# Patient Record
Sex: Male | Born: 1945 | Race: White | Hispanic: No | Marital: Married | Smoking: Former smoker
Health system: Southern US, Community
[De-identification: ages and names within clinical notes are randomized; demographics above are authoritative.]

## PROBLEM LIST (undated history)

## (undated) DIAGNOSIS — R42 Dizziness and giddiness: Secondary | ICD-10-CM

## (undated) HISTORY — DX: Dizziness and giddiness: R42

## (undated) HISTORY — PX: FOOT SURGERY: SHX648

## (undated) HISTORY — PX: ROTATOR CUFF REPAIR: SHX139

## (undated) HISTORY — PX: ANTERIOR CRUCIATE LIGAMENT REPAIR: SHX115

---

## 2007-11-30 ENCOUNTER — Emergency Department (HOSPITAL_COMMUNITY): Admission: EM | Admit: 2007-11-30 | Discharge: 2007-11-30 | Payer: Self-pay | Admitting: Emergency Medicine

## 2007-12-06 ENCOUNTER — Emergency Department (HOSPITAL_COMMUNITY): Admission: EM | Admit: 2007-12-06 | Discharge: 2007-12-06 | Payer: Self-pay | Admitting: Emergency Medicine

## 2007-12-08 ENCOUNTER — Ambulatory Visit: Payer: Self-pay | Admitting: Internal Medicine

## 2007-12-17 ENCOUNTER — Ambulatory Visit: Payer: Self-pay | Admitting: Internal Medicine

## 2007-12-17 ENCOUNTER — Ambulatory Visit: Payer: Self-pay

## 2007-12-17 ENCOUNTER — Inpatient Hospital Stay (HOSPITAL_COMMUNITY): Admission: EM | Admit: 2007-12-17 | Discharge: 2007-12-18 | Payer: Self-pay | Admitting: Emergency Medicine

## 2007-12-30 ENCOUNTER — Ambulatory Visit: Payer: Self-pay | Admitting: Internal Medicine

## 2008-01-26 ENCOUNTER — Ambulatory Visit: Payer: Self-pay | Admitting: Internal Medicine

## 2010-11-13 NOTE — H&P (Signed)
Brent Larsen, Brent Larsen                ACCOUNT NO.:  [id number]   MEDICAL RECORD NO.:  [id number]          PATIENT TYPE:  INP   LOCATION:  2005                         FACILITY:  [facility]   PHYSICIAN:  Everardo Beals. Juanda Chance, MD, FACCDATE OF BIRTH:  Aug 31, 1945   DATE OF ADMISSION:  12/17/2007  DATE OF DISCHARGE:                              HISTORY & PHYSICAL   PRIMARY CARDIOLOGIST:  Bevelyn Buckles. Bensimhon, MD   PRIMARY CARE [place] Olkowski:  Dr. Carola Frost   PATIENT PROFILE:  A 65 year old married Caucasian male without prior  history of coronary artery disease who presents following a stress test  during which he had nonsustained ventricular tachycardia which was  asymptomatic.   PROBLEMS:  1. Nonsustained ventricular tachycardia (asymptomatic).  2. History of weakness spells with bilateral arm discomfort.      a.     December 17, 2007, Myoview exercise time 8 minutes, maximum       heart rate 118 beats per minute, hypertensive response to exercise       with a maximum blood pressure of 198/90.  No chest pain.       Nonsustained ventricular tachycardia.  Reportedly negative images.  3. Hypertension.  4. Obesity.  5. Problem with chronic obstructive pulmonary disease.  6. Remote tobacco abuse.  7. History of presyncope.  8. Dental abscess, currently on oral amoxicillin.   HISTORY OF PRESENT ILLNESS:  A 65 year old married Caucasian male  without prior cardiac history.  He presented to [facility],  November 30, 2007, secondary to complaints of full-body weakness with  bilateral arm heaviness and discomfort.  He also had mild presyncope.  Symptoms lasted between 30 seconds and a minute, and recurred one  additional time.  At [facility], he ruled out for MI and had a CT scan  which was normal.  He was discharged home.  He had a second presentation  at [facility] on December 06, 2007, secondary to pain under his right rib.  This was reproducible and dissimilar to his first presentation.  He saw  Dr. Gala Romney on December 08, 2007, and was set up for a Myoview which took  place this morning.  While he was exercising, he had asymptomatic  nonsustained ventricular tachycardia.  Imaging was reportedly normal.  Because of his VT, the patient was sent to the [facility] ED for  admission and catheterization today.  He is currently asymptomatic.   ALLERGIES:  CODEINE and HYDROCODONE.   HOME MEDICATIONS:  1. Aspirin 81 mg daily  2. Amoxicillin 500 mg t.i.d.  The patient needs to complete another      week.   FAMILY HISTORY:  His mother died in her early 39s of breathing  problems, father is alive and well at age 70.  He has 2 brothers and a  sister who are alive and well.   SOCIAL HISTORY:  He lives in [place] with his wife, and works as  Human resources officer.  He is married, with 3 children.  He has about 20-  pack-year history of tobacco abuse, always smoking less than a pack a  day for about 40 years and quit for about 13 years in the middle and  then most recently quit 6 months ago.  He has 1-2 alcoholic beverages a  day.  He is not routinely exercising.   REVIEW OF SYSTEMS:  Positive for weakness and bilateral arm heaviness  and discomfort on November 30, 2007.  Right-sided rib pain on December 06, 2007.  Asymptomatic VT today.  Otherwise, all systems reviewed negative.   PHYSICAL EXAMINATION:  VITAL SIGNS:  Temperature 98.0, heart rate 52,  respirations 20, blood pressure 151/79, and pulse ox 98% on room air.  GENERAL:  Pleasant white male, in no acute distress.  Awake, alert, and  oriented x3.  HEENT:  Normal.  NEURO:  Grossly intact, nonfocal.  SKIN:  Warm and dry without lesion or masses.  NECK:  JVD.  LUNGS:  Respiration is 20 and labored.  Clear to auscultation.  CARDIAC:  Regular S1 and S2.  No S3, S4, or murmurs.  ABDOMEN:  Round, soft, nontender, and nondistended.  Bowel sounds  present.  EXTREMITIES:  Lower extremities warm, dry and pink.  No clubbing,  cyanosis, or  edema.  Dorsalis pedis posterior tibial pulses 2+, ankle  bilaterally.  No femoral bruits were noted.   CLINICAL FINDINGS:  Chest x-ray, EKG, and laboratory work is pending.   ASSESSMENT/PLAN:  1. Nonsustained ventricular tachycardia, this occurred doing exercise      Myoview this morning.  Images were reportedly normal.  The patient      is set up for catheterization this afternoon.  Plan to admit, check      cardiac markers.  Aspirin beta-blocker and statin.  2. Lipid status, currently unknown.  Check lipids and LFTs as STAT.  3. Tobacco abuse.  Continue cessation advise.      Nicolasa Ducking, ANP      Bruce R. Juanda Chance, MD, [facility]  Electronically Signed    CB/MEDQ  D:  12/17/2007  T:  12/18/2007  Job:  [id number]

## 2010-11-13 NOTE — Assessment & Plan Note (Signed)
[facility]                            CARDIOLOGY OFFICE NOTE   GUILLERMO, NEHRING                       MRN:          [id number]  DATE:12/30/2007                            DOB:          January 25, 1946    PRIMARY CARE PHYSICIAN:  Jonita Albee, MD.   INTERVAL HISTORY:  Brent Larsen is a 65 year old male whom I initially saw  about a month ago for some upper body weakness and dizziness.  This was  fairly atypical.  However, given his risk factors, we set him up for a  treadmill Myoview; he had good exercise capacity, however, with normal  perfusion and normal LV function.  He had, however, at the very end of  exercise, he developed what seemed to be ventricular tachycardia with  fusion beats.  This was asymptomatic.  He subsequently underwent cardiac  catheterization which showed normal coronary arteries and normal LV  function.  He was seen by Dr. Ladona Ridgel who thought this might be RV  outflow tract VT.  He recommended considering a low-dose beta-blocker,  though I, given his normal LV function, felt that he may not need any  therapy at all.  He did suggest possibly considering an echo or cardiac  MRI to make sure that there is no RV dysplasia.   Mr. Rocky Crafts returns today for routine followup.  He says that Lopressor has  made him feel worse.  He is very lightheaded, especially when standing.  He has not had any palpitations.  He continues to have nearly chronic  tingling down both arms.  He has not had any syncope.   CURRENT MEDICATIONS:  1. Aspirin 81 a day.  2. Lopressor 12.5 b.i.d.  3. Lovastatin 40 a day.   PHYSICAL EXAMINATION:  He is well appearing, in no acute distress,  ambulating around clinic without respiratory difficulty.  Blood pressure is 120/76, heart rate is 45, and weight is 209.  HEENT:  Normal.  NECK:  Supple.  No JVD.  Carotid are 2+ bilaterally without any bruits.  There is no lymphadenopathy or thyromegaly.  CARDIAC:  PMI is  nondisplaced.  He is bradycardic and regular.  No  murmurs.  LUNGS:  Clear.  ABDOMEN:  Obese, nontender, and nondistended.  No hepatosplenomegaly.  No bruits.  No masses appreciated.  EXTREMITIES:  Warm with no cyanosis, clubbing, or edema.  No rash.  NEUROLOGIC:  Alert and oriented x3.  Cranial nerves II through XII are  intact.  He moves all 4 extremities without difficulty.  Affect is  pleasant.   EKG shows marked sinus bradycardia at a rate of 46 with a right bundle  branch block.  No ST-T wave abnormalities.   ASSESSMENT AND PLAN:  1. Exercise-induced ventricular tachycardia.  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  He has      been seen by Dr. Ladona Ridgel.  Unfortunately, he is unable to tolerate      the beta-blocker due to bradycardia, so we will stop this.      Currently, no further therapy is indicated.  We will put a Holter      monitor on him to make sure he is not having significant ectopy at      baseline.  We did consider getting an echo or MRI of his heart to      rule out right ventricular dysplasia, but he is concerned about      cost and we will hold off on this for now.  2. Upper extremity tingling.  I do not think this is cardiac in      nature.  I will refer him to go for neurology for further      evaluation.  3. Hyperlipidemia.  Continue lovastatin.  Recheck lipids in 3 months.   DISPOSITION:  Return to clinic in 6 months.     Bevelyn Buckles. Bensimhon, MD  Electronically Signed    DRB/MedQ  DD: 12/30/2007  DT: 12/31/2007  Job #: [id number]   cc:   Jonita Albee, M.D.  [facility] Neurology

## 2010-11-13 NOTE — Assessment & Plan Note (Signed)
[facility]                            CARDIOLOGY OFFICE NOTE   [name], [name]                       MRN:          [id number]  DATE:12/17/2007                            DOB:          04-17-1946    HISTORY AND HOSPITAL COURSE:  Mr. Brent Larsen is seen briefly in the office  after having had a stress test.  He had several episodes in the previous  week, where he had become acutely lightheaded and weak.  He felt his  fingertips tingling and both arms felt week.  He states that he felt a  whole way of weakness come over his upper body.  Because of his overall  situation, he was seen by Dr. Gala Romney, and they carefully discussed  various options.  He had no defining findings on physical examination.  Because of this, they were not sure as to the etiology.  They set him up  for a myocardial perfusion imaging study.  He was able to exercise  today, 8 minutes.  With this, the patient developed what appeared to be  a wide complex tachycardia with VT fusion beats as was interpreted by  Dr. Graciela Husbands.  No definite electrocardiographic abnormalities.  He has a  right bundle pattern at baseline.  The wide complex tachycardia  subsequently resolved.  The patient was currently feeling fine.  I spoke  with Dr. Gala Romney by phone.  They feel appropriately that the best  approach at this time would be to go ahead and do a cardiac  catheterization.  His myocardial perfusion images have not been formally  interpreted, but there is no large defect, and the ejection fraction is  normal.  I have explained this to the patient in detail.  He has had  symptoms of dizziness, and has a fairly clear-cut wide complex  tachycardia on his exercise tracings today.  With this, they will go  ahead and bring him over to the hospital to the emergency room.  He will  be seen by the PA and worked up.  Cardiac catheterization will be done  by Dr. Gala Romney this afternoon.  The patient  understands and agrees  with this plan.     Brent Larsen. Riley Kill, MD, [facility]  Electronically Signed    TDS/MedQ  DD: 12/17/2007  DT: 12/18/2007  Job #: [id number]   cc:   Bevelyn Buckles. Bensimhon, MD

## 2010-11-13 NOTE — Assessment & Plan Note (Signed)
[facility]                            CARDIOLOGY OFFICE NOTE   TYLEN, LEVERICH                       MRN:          [id number]  DATE:12/08/2007                            DOB:          [birth year]    PRIMARY CARE PHYSICIAN:  Jonita Albee, MD at [facility].   REASONS FOR CONSULTATION:  1. Chest pain.  2. Weakness.   HISTORY OF PRESENT ILLNESS:  Mr. Brent Larsen is a very pleasant 65 year old  male with a history of hypertension, obesity, and probable COPD.  He has  no known history of coronary artery disease.  He has never had a cardiac  catheterization or a stress test.   Last week, he had 2 episodes where he became acutely lightheaded and  weak.  He felt his fingertips tingling and both arms felt weak.  He felt  like a whole wave of weakness came over just to his upper body.  There  was no involvement with his legs.  He felt mildly presyncopal, this  lasted about 30 seconds to a minute, then resolved and happened twice.  There is no palpitations.  He did have 1 episode of diaphoresis.  He  went to [facility], he had a complete workup including EKG and  cardiac markers, these were all negative.  The only thing that they  found was some hematuria.  He had a CT scan and this was normal.  He was  discharged to home.   He then returned on Sunday with some pain around his right ribs.  This  just felt like a chest wall pressure, but given his recent episodes, he  went back to the emergency room.  Once again, he ruled out for  myocardial infarction with serial cardiac markers.  There was no mention  of any arrhythmias and he was referred here for further evaluation.   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  Otherwise, has not had any exertional chest pressure or  significant dyspnea.  He denies any palpitations.  No previous syncope  or presyncope.   REVIEW OF SYSTEMS:  Notable for anxiety due to recent  job troubles.  He  also has arthritis pain.  He has not had any other focal neurologic  findings.  No seizure activity.  Remainder of review of systems is  otherwise normal except for HPI and problem list.   PAST MEDICAL HISTORY:  1. Notable for obesity.  2. Probable COPD.  3. Hypertension.   CURRENT MEDICATIONS:  None.   ALLERGIES:  CODEINE.   SOCIAL HISTORY:  He is married.  He has 3 kids.  He works as a Associate Professor.  History of tobacco less than 1 pack a day x40 years,  although he quit for 13 years in the middle.  He quit most recently 6  months ago.  He does have 1 to 2 glasses of alcohol a day.   FAMILY HISTORY:  Mother died in her early 72s due to breathing problems.  Father is alive at 21.  He  did have an uncle who had heart problems  before age 91.   PHYSICAL EXAM:  He is in no acute distress.  Ambulates around the clinic  without any respiratory difficulty.  Blood pressure is 148/80, heart rate is 64, and weight is 208.  HEENT:  Normal.  NECK:  Supple.  There is no JVD.  Carotids are 2+ bilaterally without  bruits.  There is no lymphadenopathy or thyromegaly.  CARDIAC:  PMI is nondisplaced.  Regular rate and rhythm.  No murmurs,  rubs, or gallops.  LUNGS:  Clear with mildly decreased air movement throughout.  No  wheezing.  ABDOMEN:  Obese with an umbilical hernia.  It is nontender and  nondistended.  No hepatosplenomegaly.  No bruits.  No masses are  appreciated.  EXTREMITIES:  Warm with no cyanosis, clubbing, or edema.  SKIN:  No rash.  NEURO:  Alert and oriented x3.  Cranial nerves II through XII are  grossly intact.  He moves all 4 extremities without difficulty.  There  is no pronator drift.  Finger-to-nose examination is normal.  Rapid  alternating hand movements are normal.   EKG shows sinus bradycardia at a rate of 57 with a right bundle-branch  block with a QRS duration of 130 msec.   ASSESSMENT/PLAN:  1. Upper body weakness.  I think this  is likely not cardiac, although      it is possible that it could have been a bradycardic event or      possible vagal event.  We did discuss the possibility of putting a      Holter monitor on him and then doing an echocardiogram, but given      the fact that he does not have any insurance, he is quite concerned      about cost and I think that we can hold off on this unless he has a      recurrent episode.  In association with some chest pain and his      risk factors, I do think, however, it is reasonable to do a      treadmill Myoview to rule out underlying ischemia and make sure he      has a normal ejection fraction.  I have asked him to go ahead and      start baby aspirin.  I also think there is a possibility that this      could obviously be noncardiac perhaps a cervical disk or other      factors.  He does not have any focal neurologic findings, but as      his cardiac workup is negative, he may warrant further workup.  2. Chest pain as above.  3. Hypertension.  He will follow up with his primary care physician.   DISPOSITION:  Pending results of his stress test.     Bevelyn Buckles. Bensimhon, MD  Electronically Signed    DRB/MedQ  DD: 12/08/2007  DT: 12/09/2007  Job #: [id number]   cc:   Donnetta Hutching, MD  Jonita Albee, M.D.

## 2010-11-13 NOTE — Discharge Summary (Signed)
NAMEMANASES, ETCHISON                ACCOUNT NO.:  [id number]   MEDICAL RECORD NO.:  [id number]          PATIENT TYPE:  INP   LOCATION:  2005                         FACILITY:  [facility]   PHYSICIAN:  Nicolasa Ducking, ANP DATE OF BIRTH:  1945-08-19   DATE OF ADMISSION:  12/17/2007  DATE OF DISCHARGE:  12/18/2007                               DISCHARGE SUMMARY   PRIMARY CARDIOLOGIST:  Bevelyn Buckles. Bensimhon, MD.   PRIMARY CARE Amardeep Beckers:  Jonita Albee, M.D.   DISCHARGE DIAGNOSIS:  Nonsustained ventricular tachycardia.   SECONDARY DIAGNOSES:  1. Hypertension.  2. Hyperlipidemia.  3. Presyncope.  4. Normal coronary arteries with normal LV function on cardiac      catheterization this admission.  5. Obesity.  6. Remote tobacco abuse with probable chronic obstructive pulmonary      disease.  7. Recent dental abscess.   ALLERGIES:  CODEINE and HYDROCODONE.   PROCEDURE:  Left heart cardiac catheterization.   HISTORY OF PRESENT ILLNESS:  A 65 year old Caucasian male without prior  cardiac history.  He was recently seen by Dr. Gala Romney in clinic December 08, 2007, following an episode of weakness and a subsequent episode of  right-sided chest discomfort.  He was set up for an exercise Myoview  which took place December 17, 2007, and it was noted during stage 3 of  exercise.  The patient developed an asymptomatic wide complex  tachycardia felt to be nonsustained ventricular tachycardia.  Due to  this arrhythmia, the patient was sent to the emergency room for  admission and for cardiac catheterization.   HOSPITAL COURSE:  Mr. Rocky Crafts ruled out for MI.  He remained asymptomatic  during hospitalization.  He underwent left heart cardiac catheterization  this morning revealing normal coronary arteries, normal LV function with  an EF of 60%.  Electrophysiology was consulted secondary to his wide  complex tachycardia and will be seen by Dr. Lewayne Bunting.  It was  recommended that we continue low-dose  beta-blocker therapy and that  echocardiogram could be obtained as an outpatient.  Mr. Rocky Crafts will be  discharged home this evening in satisfactory condition.   DISCHARGE LABORATORIES:  Hemoglobin 15.4, hematocrit 45.5, WBC 12.3,  platelets 245, MCV 90.6, sodium 139, potassium 4.7, chloride 108, CO2  26, BUN 16, creatinine 0.85, glucose 95, INR 0.9, total bilirubin 1.1,  alkaline phosphatase 43, AST 19, ALT 17, albumin 4.0.  Cardiac markers  negative x3.  Total cholesterol 234, triglycerides 105, HDL 34, LDL 179,  calcium 9.1.  TSH 1.307.   DISPOSITION:  The patient is being discharged home today in good  condition.   FOLLOWUP PLANS AND APPOINTMENT:  We will arrange followup with Dr. Nicholes Mango in approximately 2 weeks.   DISCHARGE MEDICATIONS:  1. Aspirin 81 mg daily.  2. Lovastatin 40 mg nightly.  3. Lopressor 25 mg half a tablet b.i.d.   OUTSTANDING LAB STUDIES:  None.   DURATION OF DISCHARGE/ENCOUNTER:  40 minutes including physician time.      Nicolasa Ducking, ANP     CB/MEDQ  D:  12/18/2007  T:  12/19/2007  Job:  [id number]   cc:   Jonita Albee, M.D.

## 2010-11-13 NOTE — Consult Note (Signed)
Brent Larsen, Brent Larsen                ACCOUNT NO.:  [id number]   MEDICAL RECORD NO.:  [id number]          PATIENT TYPE:  INP   LOCATION:  2005                         FACILITY:  [facility]   PHYSICIAN:  Doylene Canning. Ladona Ridgel, MD    DATE OF BIRTH:  1946-03-26   DATE OF CONSULTATION:  DATE OF DISCHARGE:  12/18/2007                                 CONSULTATION   Mr. Rocky Crafts was referred today by Dr. Arvilla Meres for evaluation of  abnormal EKG during exercise treadmill testing.  The patient is a very  pleasant 65 year old man with  periods of episodic weakness, some very  mild dizziness in the arm, fatigue as well as right-sided chest  discomfort.  The patient underwent exercise stress test which  demonstrated hence a development of a wide QRS tachycardia, his baseline  EKG has a right bundle branch block pattern.  He was in a intermittent  left bundle and then sometimes right bundle tachycardia, unclear whether  this represented some unusual degree of aberration or a VT with fusion  beats.  The patient was asymptomatic and had no recollection or  awareness that his rhythm had changed during the treadmill test for  which the symptoms occurred at the very end of exercise.  He has never  had frank syncope.  He denies anginal symptoms.   PAST MEDICAL HISTORY:  Notable for dyslipidemia.  He has a history of  morbid obesity, but has recently lost over 30 pounds.   SOCIAL HISTORY:  The patient is married.  He quit smoking cigarettes 6  months ago.  He drinks 1 or 2 alcoholic beverages a day.  He works as a  Tax adviser.   FAMILY HISTORY:  Noncontributory.   REVIEW OF SYSTEMS:  Is really quite negative except as noted in the HPI.  He has occasional episodes of dizziness, but no syncope.   LABORATORY EVALUATION:  Normal.   PHYSICAL EXAMINATION:  GENERAL:  He is a pleasant well-appearing middle-  aged man in no distress.  VITALS:  Blood pressure was 135/82, the pulse 55 and regular,  respirations were  18, and temperature was 97.4.  HEENT:  Normocephalic and atraumatic.  Pupils equal and round.  Oropharynx moist.  Sclerae anicteric.  NECK:  Revealed no jugular venous distention.  No thyromegaly.  Trachea  is midline and the carotids are 2+ and symmetric.  LUNGS:  Clear to bilateral auscultation.  No wheezes, rales, or rhonchi  are present.  There is no indication for short of breathing.  CARDIOVASCULAR:  Regular, rate, and rhythm and normal S1 and S2.  No  murmurs, rubs or gallops were appreciated.  The PMI was not enlarged nor  the valve displaced.  ABDOMEN:  Soft, nontender, nondistended, normal organomegaly.  Bowel  sounds are present.  No rebound or guarding noted.  EXTREMITIES:  Demonstrate no cyanosis, clubbing or edema.  The pulses  were 2+ and symmetric.  NEUROLOGIC:  Alert and oriented x2.  Cranial nerves were intact.  Strength is 5/5 and symmetric.  EKG demonstrates sinus rhythm with right  bundle branch block.   IMPRESSION:  1. Possible/probable exercise-induced ventricular tachycardia with a      left bundle QRS morphology.  2. History of weakness and arm heaviness.   DISCUSSION:  The etiology of the patient's abnormal EKG is unclear, but  if we assume that it was exercising-induced VT (questionable RV outflow  tract) then initiation of a beta-blocker would be a very reasonable  approach.  One could certainly argue that in the absence of symptoms  that no treatment was indicated.  However, adding beta-blocker therapy  to start with would be a reasonable thing to do.  At some point, a 2D  echo will be warranted to rule out abnormal right ventricle.  I will be  happy to see the patient back on an as-needed basis.      Doylene Canning. Ladona Ridgel, MD  Electronically Signed     GWT/MEDQ  D:  12/18/2007  T:  12/19/2007  Job:  [id number]

## 2011-03-28 LAB — CBC
Hemoglobin: 15.7
Hemoglobin: 15.4
MCHC: 33.9
MCV: 90.6
RBC: 5.05
RDW: 13.7

## 2011-03-28 LAB — COMPREHENSIVE METABOLIC PANEL
ALT: 17
Alkaline Phosphatase: 43
BUN: 16
CO2: 26
Chloride: 108
GFR calc non Af Amer: >60
Glucose, Bld: 95
Potassium: 4.7
Sodium: 139
Total Bilirubin: 1.1
Total Protein: 6.3

## 2011-03-28 LAB — LIPID PANEL
Cholesterol: 234 — ABNORMAL HIGH
HDL: 34 — ABNORMAL LOW
LDL Cholesterol: 179 — ABNORMAL HIGH
Total CHOL/HDL Ratio: 6.9

## 2011-03-28 LAB — DIFFERENTIAL
Basophils Absolute: 0.1
Basophils Relative: 0
Eosinophils Absolute: 0.2
Neutro Abs: 9 — ABNORMAL HIGH
Neutrophils Relative %: 73

## 2011-03-28 LAB — URINALYSIS, ROUTINE W REFLEX MICROSCOPIC
Bilirubin Urine: NEGATIVE
Glucose, UA: NEGATIVE
Specific Gravity, Urine: 1.006
pH: 6.5

## 2011-03-28 LAB — POCT CARDIAC MARKERS
CKMB, poc: 1.1
CKMB, poc: <1 — ABNORMAL LOW
Myoglobin, poc: 53.2
Operator id: 264031

## 2011-03-28 LAB — BASIC METABOLIC PANEL
Calcium: 9
GFR calc Af Amer: >60
GFR calc non Af Amer: >60
Sodium: 140

## 2011-03-28 LAB — POCT I-STAT, CHEM 8
Glucose, Bld: 90
HCT: 46
Hemoglobin: 15.6
Potassium: 4
Sodium: 139
TCO2: 24

## 2011-03-28 LAB — CARDIAC PANEL(CRET KIN+CKTOT+MB+TROPI)
Relative Index: 1.3
Total CK: 111
Troponin I: 0.01

## 2011-03-28 LAB — URINE MICROSCOPIC-ADD ON

## 2011-03-28 LAB — PROTIME-INR
INR: 0.9
Prothrombin Time: 12.5

## 2011-03-28 LAB — CK TOTAL AND CKMB (NOT AT ARMC): CK, MB: 2

## 2011-03-28 LAB — APTT: aPTT: 26

## 2012-06-08 ENCOUNTER — Ambulatory Visit (INDEPENDENT_AMBULATORY_CARE_PROVIDER_SITE_OTHER): Payer: Medicare Other | Admitting: Emergency Medicine

## 2012-06-08 VITALS — BP 142/82 | HR 66 | Temp 98.4°F | Resp 16 | Ht 68.0 in | Wt 250.0 lb

## 2012-06-08 DIAGNOSIS — J018 Other acute sinusitis: Secondary | ICD-10-CM

## 2012-06-08 MED ORDER — AMOXICILLIN-POT CLAVULANATE 875-125 MG PO TABS: 1.0000 | ORAL_TABLET | Freq: Two times a day (BID) | ORAL | Status: DC

## 2012-06-08 MED ORDER — PSEUDOEPHEDRINE-GUAIFENESIN ER 60-600 MG PO TB12: 1.0000 | ORAL_TABLET | Freq: Two times a day (BID) | ORAL | Status: DC

## 2012-06-08 NOTE — Progress Notes (Signed)
[facility] [address], [place] [address] [phone]  Date:  06/08/2012   Name:  Brent WILNER Sr.   DOB:  1946/05/23   MRN:  [id number]  PCP:  No primary provider on file.    Chief Complaint: Sinusitis and Sore Throat   History of Present Illness:  Brent KRAKOW Sr. is a 66 y.o. very pleasant male patient who presents with the following:  Ill for a week with nasal congestion and post nasal drainage. Has a sore throat.  No nausea or vomiting.  No stool change.  No cough, wheezing or shortness of breath.  No chest pain.  Some post nasal drainage greenish-brown.  No improvement with OTC medications.  There is no problem list on file for this patient.   No past medical history on file.  Past Surgical History  Procedure Date  . Anterior cruciate ligament repair   . Rotator cuff repair     History  Substance Use Topics  . Smoking status: Never Smoker   . Smokeless tobacco: Not on file  . Alcohol Use: 1.2 oz/week    2 Cans of beer per week    Family History  Problem Relation Age of Onset  . Asthma Mother   . Alzheimer's disease Father   . Stroke Maternal Grandfather   . Diabetes Paternal Grandmother     Allergies  Allergen Reactions  . Codeine     Medication list has been reviewed and updated.  No current outpatient prescriptions on file prior to visit.    Review of Systems:  As per HPI, otherwise negative.    Physical Examination: Filed Vitals:   06/08/12 0951  BP: 142/82  Pulse: 66  Temp: 98.4 F (36.9 C)  Resp: 16   Filed Vitals:   06/08/12 0951  Height: 5\' 8"  (1.727 m)  Weight: 250 lb (113.399 kg)   Body mass index is 38.01 kg/(m^2). Ideal Body Weight: Weight in (lb) to have BMI = 25: 164.1   GEN: WDWN, NAD, Non-toxic, A & O x 3  No rash or sepsis.  No shortness of breath HEENT: Atraumatic, Normocephalic. Neck supple. No masses, No LAD.  Oropharynx negative Ears and Nose: No external deformity.  TM negative.   Green nasal drainage CV: RRR, No M/G/R. No JVD. No thrill. No extra heart sounds. PULM: CTA B, no wheezes, crackles, rhonchi. No retractions. No resp. distress. No accessory muscle use. ABD: S, NT, ND, +BS. No rebound. No HSM. EXTR: No c/c/e NEURO Normal gait.  PSYCH: Normally interactive. Conversant. Not depressed or anxious appearing.  Calm demeanor.    Assessment and Plan: Sinusitis augmentin mucinex d Follow up as needed  Carmelina Dane, MD

## 2012-08-12 ENCOUNTER — Ambulatory Visit: Payer: Medicare Other

## 2012-08-12 ENCOUNTER — Ambulatory Visit (INDEPENDENT_AMBULATORY_CARE_PROVIDER_SITE_OTHER): Payer: Medicare Other | Admitting: Family Medicine

## 2012-08-12 VITALS — BP 161/81 | HR 64 | Temp 98.0°F | Resp 14 | Ht 67.0 in | Wt 251.0 lb

## 2012-08-12 DIAGNOSIS — M129 Arthropathy, unspecified: Secondary | ICD-10-CM

## 2012-08-12 DIAGNOSIS — M25572 Pain in left ankle and joints of left foot: Secondary | ICD-10-CM

## 2012-08-12 DIAGNOSIS — T148XXA Other injury of unspecified body region, initial encounter: Secondary | ICD-10-CM

## 2012-08-12 DIAGNOSIS — M79672 Pain in left foot: Secondary | ICD-10-CM

## 2012-08-12 DIAGNOSIS — M25579 Pain in unspecified ankle and joints of unspecified foot: Secondary | ICD-10-CM

## 2012-08-12 DIAGNOSIS — M1990 Unspecified osteoarthritis, unspecified site: Secondary | ICD-10-CM

## 2012-08-12 DIAGNOSIS — M79609 Pain in unspecified limb: Secondary | ICD-10-CM

## 2012-08-12 MED ORDER — DICLOFENAC SODIUM 1 % TD GEL: 2.0000 g | Freq: Four times a day (QID) | TRANSDERMAL | Status: DC

## 2012-08-12 NOTE — Progress Notes (Signed)
[facility]:  Office Visit  Chief Complaint:  Chief Complaint  Patient presents with  . Foot Injury    Left foot popped when he was stepping in the shower this morning.    HPI: Brent SELLITTO Sr. is a 67 y.o. male who complains of  Left foot/ankle pain this morning after stepping down a step in his bathtub. Took ibuprofen. No pain unless he stands on it. Has ROM and is sore. At rest the pain is very mild, when he tries to move on it the pain is sharp, he heard it pop twice and each time it is "excruciating".  Tried ACE wrap, and also 200 mg Ibuprofen. Prior h/o left ankle sprain and also has had bone spur removed on left medial malleolus, had a msk removed so not to produce bone spurs. From what it sounds like he may have had a Animal nutritionist Release by the podiatrist but I am not 100% sure.   History reviewed. No pertinent past medical history. Past Surgical History  Procedure Laterality Date  . Anterior cruciate ligament repair    . Rotator cuff repair    . Foot surgery     History   Social History  . Marital Status: Married    Spouse Name: N/A    Number of Children: N/A  . Years of Education: N/A   Social History Main Topics  . Smoking status: Never Smoker   . Smokeless tobacco: None  . Alcohol Use: 1.2 oz/week    2 Cans of beer per week  . Drug Use: No  . Sexually Active: Yes   Other Topics Concern  . None   Social History Narrative  . None   Family History  Problem Relation Age of Onset  . Asthma Mother   . Alzheimer's disease Father   . Stroke Maternal Grandfather   . Diabetes Paternal Grandmother    Allergies  Allergen Reactions  . Codeine    Prior to Admission medications   Medication Sig Start Date End Date Taking? Authorizing Provider  amoxicillin-clavulanate (AUGMENTIN) 875-125 MG per tablet Take 1 tablet by mouth 2 (two) times daily. 06/08/12   Phillips Odor, MD  pseudoephedrine-guaifenesin [facility] D) 60-600 MG per tablet Take 1  tablet by mouth every 12 (twelve) hours. 06/08/12 06/08/13  Phillips Odor, MD     ROS: The patient denies fevers, chills, night sweats, unintentional weight loss, chest pain, palpitations, wheezing, dyspnea on exertion, nausea, vomiting, abdominal pain, dysuria, hematuria, melena, numbness, weakness, or tingling.   All other systems have been reviewed and were otherwise negative with the exception of those mentioned in the HPI and as above.    PHYSICAL EXAM: Filed Vitals:   08/12/12 1057  BP: 161/81  Pulse: 64  Temp: 98 F (36.7 C)  Resp: 14   Filed Vitals:   08/12/12 1057  Height: 5\' 7"  (1.702 m)  Weight: 251 lb (113.853 kg)   Body mass index is 39.3 kg/(m^2).  General: Alert, no acute distress HEENT:  Normocephalic, atraumatic, oropharynx patent.  Cardiovascular:  Regular rate and rhythm, no rubs murmurs or gallops.  No Carotid bruits, radial pulse intact. No pedal edema.  Respiratory: Clear to auscultation bilaterally.  No wheezes, rales, or rhonchi.  No cyanosis, no use of accessory musculature GI: No organomegaly, abdomen is soft and non-tender, positive bowel sounds.  No masses. Skin: No rashes. Neurologic: Facial musculature symmetric. Psychiatric: Patient is appropriate throughout our interaction. Lymphatic: No cervical lymphadenopathy Musculoskeletal: Gait intact.  Left ankle-no deformities, + minimal swelling on lateral malleolus, 5/5 strength, decrease ROM due to pain, sensation intact Foot-normal exam   LABS:  EKG/XRAY:   Primary read interpreted by Dr. Conley Rolls at [facility]. No fracture or dislocations of ankle or foot + arthritic changes + heel spur   ASSESSMENT/PLAN: Encounter Diagnoses  Name Primary?  . Left ankle pain Yes  . Left foot pain   . Sprain and strain   . Arthritis    Rx Voltaren gel. DC all other NSAIDs while on Voltaren gel. Sweedo given C/w ROM F/u prn     Tajia Szeliga PHUONG, DO 08/14/2012 8:27 AM

## 2012-08-14 ENCOUNTER — Encounter: Payer: Self-pay | Admitting: Family Medicine

## 2012-08-17 ENCOUNTER — Telehealth: Payer: Self-pay | Admitting: Radiology

## 2012-08-17 NOTE — Telephone Encounter (Signed)
Message copied by Marinus Maw on Mon Aug 17, 2012  9:49 AM ------      Message from: Lenell Antu      Created: Fri Aug 14, 2012  8:44 AM       Please let him know that his official xrays show no acute boney problems. ------

## 2012-08-17 NOTE — Addendum Note (Signed)
Addended byCaffie Damme on: 08/17/2012 01:06 PM   Modules accepted: Orders, Medications

## 2012-08-17 NOTE — Telephone Encounter (Signed)
Gave pt results of xrays, he stated he understood.  He was prescribed voltaren at office, but pharmacy said due to insurance purpose more information will be needed (BCBS). He stated [facility] sent [facility] a form for [place] to fill out. He said they sent it on the 12th please look into this. Thank you! Pt will await a call back today.

## 2012-08-17 NOTE — Telephone Encounter (Signed)
Insurance will not cover Voltaren Gel. He must be unable to take oral NSAID (tried and failed more than 2 ) or on Coumadin. Please advise do you want to change this?

## 2012-08-17 NOTE — Telephone Encounter (Signed)
Dr Conley Rolls has advised to discontinue this. He will take ibuprofen 800 tid/ pharmacy notified.

## 2013-05-12 ENCOUNTER — Ambulatory Visit (INDEPENDENT_AMBULATORY_CARE_PROVIDER_SITE_OTHER): Payer: Medicare Other | Admitting: Family Medicine

## 2013-05-12 VITALS — BP 140/84 | HR 60 | Temp 98.0°F | Resp 18 | Ht 68.5 in | Wt 242.0 lb

## 2013-05-12 DIAGNOSIS — T8062XA Other serum reaction due to vaccination, initial encounter: Secondary | ICD-10-CM

## 2013-05-12 DIAGNOSIS — T50Z95A Adverse effect of other vaccines and biological substances, initial encounter: Secondary | ICD-10-CM

## 2013-05-12 MED ORDER — DICLOFENAC SODIUM 75 MG PO TBEC: 75.0000 mg | DELAYED_RELEASE_TABLET | Freq: Two times a day (BID) | ORAL | Status: DC

## 2013-05-12 NOTE — Progress Notes (Signed)
@UMFCLOGO @  This chart was scribed for Brent Sidle, MD by Quintella Reichert, ED scribe.  This patient was seen in room [facility] Room 1 and the patient's care was started at 11:09 AM.  Patient ID: Brent Larsen. MRN: [id number], DOB: 06/20/1946, 67 y.o. Date of Encounter: 05/12/2013, 11:22 AM  Primary Physician: No primary provider on file.  Chief Complaint  Patient presents with  . Back Pain  . 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  He presents with lower back pain and posterior neck pain.  He went to [facility] for a flu shot one week ago.  The next evening he began noticing soreness to his left lower back that is brought on only when he touches the area.  He describes pain as "it's like it's in the skin" and at a severity of 2/10.  He is concerned that it may be shingles.  Over the past 3 days he has also developed worsening pain and "stiffness" to the back of his neck.  He states that last night this pain was at a severity of "12/10" and he was unable to sleep due to pain.  His wife massaged the area last night which provided some relief.  He has chronic weakness in his angles but he denies any new leg weakness.  He also notes that on waking this morning his left eye redness was exacerbated.  He has had conjunctivitis off-and-on in that eye for "some time" and he is on Tobradex eye drops prescribed by his optometrist (Dr. Zipporah Plants).     History reviewed. No pertinent past medical history.    Prior to Admission medications   Medication Sig Start Date End Date Taking? Authorizing Provider  Acetaminophen (TYLENOL PO) Take by mouth.   Yes Historical Provider, MD  tobramycin-dexamethasone [facility]) ophthalmic solution every 4 (four) hours while awake.   Yes Historical Provider, MD  diclofenac (VOLTAREN) 75 MG EC tablet Take 1 tablet (75 mg total) by mouth 2 (two) times daily. 05/12/13   Brent Sidle, MD     Allergies:  Allergies  Allergen Reactions  . Codeine   . Influenza Vaccine Live     Stiff neck, neuropathic back and abdominal pain    History   Social History  . Marital Status: Married    Spouse Name: Brent Larsen    Number of Children: Brent Larsen  . Years of Education: Brent Larsen   Occupational History  . Not on file.   Social History Main Topics  . Smoking status: Never Smoker   . Smokeless tobacco: Not on file  . Alcohol Use: 1.2 oz/week    2 Cans of beer per week  . Drug Use: No  . Sexual Activity: Yes   Other Topics Concern  . Not on file   Social History Narrative  . No narrative on file     Review of Systems: Constitutional: negative for chills, fever, night sweats, weight changes, or fatigue  HEENT: positive for left eye redness.  negative for vision changes, hearing loss, congestion, rhinorrhea, ST, epistaxis, or sinus pressure Cardiovascular: negative for chest pain or palpitations Respiratory: negative for hemoptysis, wheezing, shortness of breath, or cough Abdominal: negative for abdominal pain, nausea, vomiting, diarrhea, or constipation Musculoskeletal: positive for back pain and neck pain. Dermatological: negative for rash Neurologic: negative for new weakness, headache, dizziness, or syncope All other systems reviewed and are otherwise negative with the exception to those above  and in the HPI.   Physical Exam: Blood pressure 140/84, pulse 60, temperature 98 F (36.7 C), temperature source Oral, resp. rate 18, height 5' 8.5" (1.74 m), weight 242 lb (109.77 kg), SpO2 96.00%., Body mass index is 36.26 kg/(m^2). General: Well developed, well nourished, in no acute distress. Head: Mild injection to left eye.  Fundi normal.  Normocephalic, atraumatic, eyes without discharge, sclera non-icteric, nares are without discharge. Bilateral auditory canals clear, TM's are without perforation, pearly grey and translucent with reflective cone of light bilaterally. Oral cavity  moist, posterior pharynx without exudate, erythema, peritonsillar abscess, or post nasal drip.  Neck: Supple. No thyromegaly. Full ROM. No lymphadenopathy. Lungs: Clear bilaterally to auscultation without wheezes, rales, or rhonchi. Breathing is unlabored. Heart: RRR with S1 S2. No murmurs, rubs, or gallops appreciated. Abdomen: Soft, non-tender, non-distended with normoactive bowel sounds. No hepatomegaly. No rebound/guarding.  Msk:  Strength and tone normal for age. Extremities/Skin: Warm and dry. No clubbing or cyanosis. No edema. No rashes or suspicious lesions. Neuro: Alert and oriented X 3. Moves all extremities spontaneously. Gait is normal. CNII-XII grossly in tact. Psych:  Responds to questions appropriately with a normal affect.     ASSESSMENT AND PLAN:  67 y.o. year old male with Adverse reaction to vaccine, initial encounter - Plan: diclofenac (VOLTAREN) 75 MG EC tablet     Signed, Brent Sidle, MD 05/12/2013 11:22 AM

## 2013-06-21 ENCOUNTER — Ambulatory Visit (INDEPENDENT_AMBULATORY_CARE_PROVIDER_SITE_OTHER): Payer: Medicare Other | Admitting: Internal Medicine

## 2013-06-21 VITALS — BP 150/80 | HR 69 | Temp 98.1°F | Resp 16 | Ht 69.0 in | Wt 245.0 lb

## 2013-06-21 DIAGNOSIS — J301 Allergic rhinitis due to pollen: Secondary | ICD-10-CM

## 2013-06-21 DIAGNOSIS — J019 Acute sinusitis, unspecified: Secondary | ICD-10-CM

## 2013-06-21 MED ORDER — FLUTICASONE PROPIONATE 50 MCG/ACT NA SUSP: NASAL | Status: AC

## 2013-06-21 MED ORDER — AMOXICILLIN 500 MG PO CAPS: 1000.0000 mg | ORAL_CAPSULE | Freq: Two times a day (BID) | ORAL | Status: AC

## 2013-06-21 NOTE — Progress Notes (Addendum)
   Subjective:    Patient ID: Brent Banker Sr., male    DOB: 1946-05-06, 67 y.o.   MRN: [id number] This chart was scribed for Brent Sia, MD by Nicholos Johns, Medical Scribe. This patient's care was started at 11:28 AM.  HPI HPI Comments: Brent ORTEZ Sr. is a 67 y.o. male who presents to the [facility] complaining of sinus congestion, onset 4 weeks ago. Pt states initial symptoms were sneezing and watery eyes; nasal congestion soon came after. Pt states he is breathing through his mouth at night to help with sleeping. Pt has taken Claritin and Mucinex with little relief.  Pt states the only thing that seems to provide significant relief is Anafrin. Pt has trouble with snoring. Denies coughing and ear pressure. No observed apnea or daytime somnolence   Review of Systems  HENT: Negative for ear pain.   Respiratory: Negative for cough.       Objective:   Physical Exam  Vitals reviewed. Constitutional: He appears well-developed and well-nourished.  HENT:  Head: Normocephalic and atraumatic.  Right Ear: External ear normal.  Left Ear: External ear normal.  Mouth/Throat: Oropharynx is clear and moist.  Pur nasal d/c  Neck: Neck supple.  Cardiovascular: Normal rate, regular rhythm and normal heart sounds.   Pulmonary/Chest: Effort normal and breath sounds normal. He has no wheezes.  Lymphadenopathy:    He has no cervical adenopathy.  Neurological: He is alert.  Skin: Skin is warm and dry.  Psychiatric: He has a normal mood and affect. His behavior is normal.    Filed Vitals:   06/21/13 1111  BP: 150/80  Pulse: 69  Temp: 98.1 F (36.7 C)  TempSrc: Oral  Resp: 16  Height: 5\' 9"  (1.753 m)  Weight: 245 lb (111.131 kg)  SpO2: 93%       Assessment & Plan:     I have completed the patient encounter in its entirety as documented by the scribe, with editing by me where necessary. Makih Stefanko P. Merla Riches, M.D. Allergic rhinitis  Acute sinusitis,  unspecified  Meds ordered this encounter  Medications  . amoxicillin (AMOXIL) 500 MG capsule    Sig: Take 2 capsules (1,000 mg total) by mouth 2 (two) times daily.    Dispense:  40 capsule    Refill:  0  . fluticasone (FLONASE) 50 MCG/ACT nasal spray    Sig: 2 spr each nostril hs    Dispense:  16 g    Refill:  6  single spray of Afrin at night, 15 minutes later, 2 sprays of Flonase, cont afrin 3 months  Needs CPE age /reck BP

## 2013-10-27 ENCOUNTER — Ambulatory Visit (INDEPENDENT_AMBULATORY_CARE_PROVIDER_SITE_OTHER): Payer: Medicare Other | Admitting: Emergency Medicine

## 2013-10-27 VITALS — BP 121/83 | HR 64 | Temp 98.1°F | Resp 18 | Wt 240.0 lb

## 2013-10-27 DIAGNOSIS — H1010 Acute atopic conjunctivitis, unspecified eye: Secondary | ICD-10-CM

## 2013-10-27 DIAGNOSIS — J309 Allergic rhinitis, unspecified: Secondary | ICD-10-CM

## 2013-10-27 DIAGNOSIS — H1045 Other chronic allergic conjunctivitis: Secondary | ICD-10-CM

## 2013-10-27 MED ORDER — AZELASTINE HCL 0.05 % OP SOLN: 1.0000 [drp] | Freq: Two times a day (BID) | OPHTHALMIC | Status: DC

## 2013-10-27 MED ORDER — MONTELUKAST SODIUM 10 MG PO TABS: 10.0000 mg | ORAL_TABLET | Freq: Every day | ORAL | Status: DC

## 2013-10-27 NOTE — Progress Notes (Signed)
   Subjective:    Patient ID: Brent BankerMichael S Carvalho Sr., male    DOB: Apr 27, 1946, 68 y.o.   MRN: [id number] This chart was scribed for Lesle ChrisSteven Ivar Domangue, MD by Marica OtterNusrat Rahman, ED Scribe. This patient was seen in room 4 and the patient's care was started at 10:44 AM.    HPI HPI Comments: Brent BankerMichael S Poteet Sr. is a 68 y.o. male who presents to the [facility] complaining of intermittent conjunctivitis in his left eye onset 4-5 months ago. Pt describes a burning sensation in his left eye and complains of watering of the left eye. Pt also complains of associated intermittent congestion and allergies. Specifically, pt reports that his allergies began approximately 2 years ago and he is currently taking Zyrtec, Sudafed and Flonase for his allergies.   Pt states he wears contacts: pt wore acuvue multifocal contacts without problems, however, pt's optometrist switched pt to several different contact lenses and he began to experience problems with his eyes. Pt reports recently he has been using airoptic which he is tolerating well.   Pt further reports that his optometrist prescribed him the following for his eyes: Tobra Dex; Neomycin; Polymyxin B; Opcon A; Pataday; Pazeo and Visine Allergy. Pt reports he has used all said meds without much relief.   Pt reports his optometrist has retired and wants a new opthomologist.   Review of Systems  Constitutional: Negative for fatigue and unexpected weight change.  Eyes: Positive for pain (burning sensation. left eye), redness (Left eye) and itching (left eye).       Left eye watering of the eyes   Respiratory: Negative for cough, chest tightness and shortness of breath.   Cardiovascular: Negative for chest pain, palpitations and leg swelling.  Gastrointestinal: Negative for abdominal pain and blood in stool.  Neurological: Negative for dizziness, light-headedness and headaches.   Objective:  Physical Exam CONSTITUTIONAL: Well developed/well  nourished HEAD: Normocephalic/atraumatic EYES: EOMI/PERRL, mild erythema of the conjunctivas of the left eye.  ENMT: Mucous membranes moist, deviated septum to the left NECK: supple no meningeal signs SPINE:entire spine nontender CV: S1/S2 noted, no murmurs/rubs/gallops noted LUNGS: Lungs are clear to auscultation bilaterally, no apparent distress ABDOMEN: soft, nontender, no rebound or guarding GU:no cva tenderness NEURO: Pt is awake/alert, moves all extremitiesx4 EXTREMITIES: pulses normal, full ROM SKIN: warm, color normal PSYCH: no abnormalities of mood noted Meds ordered this encounter  Medications  . Naphazoline-Pheniramine (OPCON-A) 0.027-0.315 % SOLN    Sig: Apply to eye.  Marland Kitchen. azelastine (OPTIVAR) 0.05 % ophthalmic solution    Sig: Place 1 drop into both eyes 2 (two) times daily.    Dispense:  6 mL    Refill:  12  . montelukast (SINGULAIR) 10 MG tablet    Sig: Take 1 tablet (10 mg total) by mouth at bedtime.    Dispense:  30 tablet    Refill:  3   Assessment & Plan:  DIAGNOSTIC STUDIES: Oxygen Saturation is 97% on RA, adequate by my interpretation.    COORDINATION OF CARE: Patient to continue Claritin and Nasacort. I added Optivar eyedrops along with Singulair to help with his allergies. He has a significant septal deviation which complicates his treatment I personally performed the services described in this documentation, which was scribed in my presence. The recorded information has been reviewed and is accurate.

## 2013-10-27 NOTE — Patient Instructions (Signed)
Allergic Rhinitis Allergic rhinitis is when the mucous membranes in the nose respond to allergens. Allergens are particles in the air that cause your body to have an allergic reaction. This causes you to release allergic antibodies. Through a chain of events, these eventually cause you to release histamine into the blood stream. Although meant to protect the body, it is this release of histamine that causes your discomfort, such as frequent sneezing, congestion, and an itchy, runny nose.  CAUSES  Seasonal allergic rhinitis (hay fever) is caused by pollen allergens that may come from grasses, trees, and weeds. Year-round allergic rhinitis (perennial allergic rhinitis) is caused by allergens such as house dust mites, pet dander, and mold spores.  SYMPTOMS   Nasal stuffiness (congestion).  Itchy, runny nose with sneezing and tearing of the eyes. DIAGNOSIS  Your health care provider can help you determine the allergen or allergens that trigger your symptoms. If you and your health care provider are unable to determine the allergen, skin or blood testing may be used. TREATMENT  Allergic Rhinitis does not have a cure, but it can be controlled by:  Medicines and allergy shots (immunotherapy).  Avoiding the allergen. Hay fever may often be treated with antihistamines in pill or nasal spray forms. Antihistamines block the effects of histamine. There are over-the-counter medicines that may help with nasal congestion and swelling around the eyes. Check with your health care provider before taking or giving this medicine.  If avoiding the allergen or the medicine prescribed do not work, there are many new medicines your health care provider can prescribe. Stronger medicine may be used if initial measures are ineffective. Desensitizing injections can be used if medicine and avoidance does not work. Desensitization is when a patient is given ongoing shots until the body becomes less sensitive to the allergen.  Make sure you follow up with your health care provider if problems continue. HOME CARE INSTRUCTIONS It is not possible to completely avoid allergens, but you can reduce your symptoms by taking steps to limit your exposure to them. It helps to know exactly what you are allergic to so that you can avoid your specific triggers. SEEK MEDICAL CARE IF:   You have a fever.  You develop a cough that does not stop easily (persistent).  You have shortness of breath.  You start wheezing.  Symptoms interfere with normal daily activities. Document Released: 03/12/2001 Document Revised: 04/07/2013 Document Reviewed: 02/22/2013 [employer] Patient Information 2014 [employer].  

## 2014-07-14 DIAGNOSIS — M1711 Unilateral primary osteoarthritis, right knee: Secondary | ICD-10-CM | POA: Diagnosis not present

## 2014-07-14 DIAGNOSIS — M7661 Achilles tendinitis, right leg: Secondary | ICD-10-CM | POA: Diagnosis not present

## 2014-07-15 DIAGNOSIS — H109 Unspecified conjunctivitis: Secondary | ICD-10-CM | POA: Diagnosis not present

## 2014-07-15 DIAGNOSIS — H04123 Dry eye syndrome of bilateral lacrimal glands: Secondary | ICD-10-CM | POA: Diagnosis not present

## 2014-07-15 DIAGNOSIS — H10523 Angular blepharoconjunctivitis, bilateral: Secondary | ICD-10-CM | POA: Diagnosis not present

## 2014-07-25 ENCOUNTER — Ambulatory Visit (INDEPENDENT_AMBULATORY_CARE_PROVIDER_SITE_OTHER): Payer: Medicare Other | Admitting: Family Medicine

## 2014-07-25 ENCOUNTER — Encounter: Payer: Self-pay | Admitting: Family Medicine

## 2014-07-25 VITALS — BP 179/94 | Ht 68.5 in | Wt 245.0 lb

## 2014-07-25 DIAGNOSIS — M2142 Flat foot [pes planus] (acquired), left foot: Secondary | ICD-10-CM

## 2014-07-25 DIAGNOSIS — M2141 Flat foot [pes planus] (acquired), right foot: Secondary | ICD-10-CM

## 2014-07-25 DIAGNOSIS — M25571 Pain in right ankle and joints of right foot: Secondary | ICD-10-CM

## 2014-07-25 DIAGNOSIS — M25561 Pain in right knee: Secondary | ICD-10-CM

## 2014-07-25 DIAGNOSIS — E669 Obesity, unspecified: Secondary | ICD-10-CM | POA: Diagnosis not present

## 2014-07-26 DIAGNOSIS — M25579 Pain in unspecified ankle and joints of unspecified foot: Secondary | ICD-10-CM | POA: Insufficient documentation

## 2014-07-26 DIAGNOSIS — E669 Obesity, unspecified: Secondary | ICD-10-CM | POA: Insufficient documentation

## 2014-07-26 DIAGNOSIS — M2142 Flat foot [pes planus] (acquired), left foot: Secondary | ICD-10-CM

## 2014-07-26 DIAGNOSIS — M2141 Flat foot [pes planus] (acquired), right foot: Secondary | ICD-10-CM | POA: Insufficient documentation

## 2014-07-26 DIAGNOSIS — M25561 Pain in right knee: Secondary | ICD-10-CM | POA: Insufficient documentation

## 2014-07-26 NOTE — Assessment & Plan Note (Signed)
I think some of his knee pain may be coming from his gait abnormality with severe out toeing on the right. 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 The placed him in some custom molded orthotics today and we'll see if this helps. We also discussed weight loss.

## 2014-07-26 NOTE — Progress Notes (Signed)
   Subjective:    Patient ID: Brent BankerMichael S Jeanbaptiste Sr., male    DOB: Feb 21, 1946, 69 y.o.   MRN: [id number]  HPI Patient here for evaluation of bilateral flat feet as they pertain to recurrence of his right knee pain. He is status post right knee anterior cruciate ligament repair by Dr. Thurston HoleWainer. Had done extremely well with that until about the last month or so when he started having occasional sharp shooting pain in the lateral portion of his knee. It seemed to occur with certain movements and was intermittent. The pain shot up the lateral portion of the thigh but not down into the calf. He had no knee swelling with that. He saw his orthopedist who recommended he be evaluated for gait abnormality secondary to his bilateral pes planus. Years ago he was given some orthotics which seemed to help his feet and knees. These for out a couple of years ago and he has not used any since then except some over-the-counter orthotics. He intermittently has mild foot pain mostly medial portion of the right foot but it is 1 out of 10 and very intermittent.   Review of Systems He's had no unusual weight loss or weight gain, no fever, sweats, chills. He's had no calf pain. He's noted no ankle, foot or left knee swelling. When the right knee started bothering him a month or so ago he did have 2-3 days of knee swelling itch has resolved.    Objective:   Physical Exam  Vital signs are reviewed GEN.: Well-developed overweight male no acute distress KNEE: Right. Full range of motion flexion extension. Ligamentously intact to varus and valgus stress. Anterior drawer has a firm endpoint. The calf is soft. Distally he has intact sensation to soft touch. FEET: Bilaterally he has severe pes planus. In a standing position the right mid foot collapses medially. GAIT: He has slightly shortened stride length on the right with a very mild limp, bilaterally he has out toeing significantly more severe on the right. Heel Striker. Bilaterally  he has midfoot collapse worse on the right noted especially during stance and pushoff phase.  Patient was fitted for a : standard, cushioned, semi-rigid orthotic. The orthotic was heated, placed on the orthotic stand. The patient was positioned in subtalar neutral position and 10 degrees of ankle dorsiflexion in a weight bearing stance on the heated orthotic blank After completion of molding, a stable base was applied to the orthotic blank. The blank was ground to a stable position for weight bearing. Blank: Red Base: White Posting: None  Face to face time spent in evaluation, measurement and manufacture of custom molded orthotic was 40 minutes.       Assessment & Plan:

## 2014-08-02 ENCOUNTER — Encounter: Payer: Self-pay | Admitting: Emergency Medicine

## 2014-08-02 ENCOUNTER — Ambulatory Visit (INDEPENDENT_AMBULATORY_CARE_PROVIDER_SITE_OTHER): Payer: Medicare Other | Admitting: Emergency Medicine

## 2014-08-02 VITALS — BP 166/86 | HR 66 | Temp 98.2°F | Resp 16 | Ht 67.5 in | Wt 247.6 lb

## 2014-08-02 DIAGNOSIS — J029 Acute pharyngitis, unspecified: Secondary | ICD-10-CM | POA: Diagnosis not present

## 2014-08-02 MED ORDER — PENICILLIN V POTASSIUM 500 MG PO TABS: 500.0000 mg | ORAL_TABLET | Freq: Four times a day (QID) | ORAL | Status: DC

## 2014-08-02 NOTE — Progress Notes (Signed)
[facility] [address], [place] [id number] [phone]  Date:  08/02/2014   Name:  Brent Larsen Sr.   DOB:  17-Oct-1945   MRN:  [id number]  PCP:  Tonye PearsonOLITTLE, ROBERT P, MD    Chief Complaint: Sore Throat   History of Present Illness:  Brent BankerMichael S Ciliberto Sr. is a 69 y.o. very pleasant male patient who presents with the following:  Says daughter who works in day care was treated for strep throat and he was in contact with her He now has a sore throat No fever or chills No cough or coryza No improvement with over the counter medications or other home remedies.  Denies other complaint or health concern today.   Patient Active Problem List   Diagnosis Date Noted  . Knee pain, right 07/26/2014  . Pain in joint, ankle and foot 07/26/2014  . Pes planus of both feet 07/26/2014  . Obesity 07/26/2014    No past medical history on file.  Past Surgical History  Procedure Laterality Date  . Anterior cruciate ligament repair    . Rotator cuff repair    . Foot surgery      History  Substance Use Topics  . Smoking status: Never Smoker   . Smokeless tobacco: Not on file  . Alcohol Use: 1.2 oz/week    2 Cans of beer per week    Family History  Problem Relation Age of Onset  . Asthma Mother   . Alzheimer's disease Father   . Stroke Maternal Grandfather   . Diabetes Paternal Grandmother     Allergies  Allergen Reactions  . Codeine   . Influenza Vaccine Live     Stiff neck, neuropathic back and abdominal pain    Medication list has been reviewed and updated.  Current Outpatient Prescriptions on File Prior to Visit  Medication Sig Dispense Refill  . fluticasone (FLONASE) 50 MCG/ACT nasal spray 2 spr each nostril hs 16 g 6  . LOTEMAX 0.5 % GEL   1  . meloxicam (MOBIC) 15 MG tablet   3  . TOBRADEX ophthalmic ointment   2  . azelastine (OPTIVAR) 0.05 % ophthalmic solution Place 1 drop into both eyes 2 (two) times daily. (Patient not taking: Reported  on 08/02/2014) 6 mL 12  . montelukast (SINGULAIR) 10 MG tablet Take 1 tablet (10 mg total) by mouth at bedtime. (Patient not taking: Reported on 08/02/2014) 30 tablet 3  . Naphazoline-Pheniramine (OPCON-A) 0.027-0.315 % SOLN Apply to eye.     No current facility-administered medications on file prior to visit.    Review of Systems:  As per HPI, otherwise negative.    Physical Examination: Filed Vitals:   08/02/14 1554  BP: 166/86  Pulse: 66  Temp: 98.2 F (36.8 C)  Resp: 16   Filed Vitals:   08/02/14 1554  Height: 5' 7.5" (1.715 m)  Weight: 247 lb 9.6 oz (112.311 kg)   Body mass index is 38.19 kg/(m^2). Ideal Body Weight: Weight in (lb) to have BMI = 25: 161.7  GEN: WDWN, NAD, Non-toxic, A & O x 3 HEENT: Atraumatic, Normocephalic. Neck supple. No masses, No LAD. Ears and Nose: No external deformity. CV: RRR, No M/G/R. No JVD. No thrill. No extra heart sounds. PULM: CTA B, no wheezes, crackles, rhonchi. No retractions. No resp. distress. No accessory muscle use. ABD: S, NT, ND, +BS. No rebound. No HSM. EXTR: No c/c/e NEURO Normal gait.  PSYCH: Normally interactive. Conversant. Not depressed  or anxious appearing.  Calm demeanor.    Assessment and Plan: Pharyngitis Exposed to strep Pen vk  Signed,  Phillips Odor, MD

## 2014-08-02 NOTE — Patient Instructions (Signed)

## 2014-10-13 ENCOUNTER — Ambulatory Visit (INDEPENDENT_AMBULATORY_CARE_PROVIDER_SITE_OTHER): Payer: Medicare Other

## 2014-10-13 ENCOUNTER — Ambulatory Visit (INDEPENDENT_AMBULATORY_CARE_PROVIDER_SITE_OTHER): Payer: Medicare Other | Admitting: Podiatry

## 2014-10-13 ENCOUNTER — Encounter: Payer: Self-pay | Admitting: Podiatry

## 2014-10-13 VITALS — BP 178/97 | HR 65 | Resp 12

## 2014-10-13 DIAGNOSIS — M7661 Achilles tendinitis, right leg: Secondary | ICD-10-CM

## 2014-10-13 DIAGNOSIS — M79671 Pain in right foot: Secondary | ICD-10-CM

## 2014-10-13 MED ORDER — TRIAMCINOLONE ACETONIDE 10 MG/ML IJ SUSP
10.0000 mg | Freq: Once | INTRAMUSCULAR | Status: AC
  Administered 2014-10-13: 10 mg

## 2014-10-13 NOTE — Progress Notes (Signed)
Subjective:     Patient ID: Brent BankerMichael S Vinal Sr., male   DOB: October 14, 1945, 69 y.o.   MRN: [id number]  HPI patient states the back of the right heel has been sore and bothering him for about 3 months. He tried some Voltaren gel which did not help and he's tried shoe gear modification and states that's not been effective   Review of Systems  All other systems reviewed and are negative.      Objective:   Physical Exam  Constitutional: He is oriented to person, place, and time.  Cardiovascular: Intact distal pulses.   Musculoskeletal: Normal range of motion.  Neurological: He is oriented to person, place, and time.  Skin: Skin is warm.  Nursing note and vitals reviewed.  neurovascular status intact with muscle strength adequate and range of motion subtalar midtarsal joint within normal limits. Patient's noted to have good digital perfusion is well oriented 3 with mild equinus condition and is noted to have quite a bit of discomfort in the lateral aspect of the Achilles tendon insertion right with a central and medial been okay. Patient does walk with a mild compensation in gait     Assessment:     Achilles tendinitis right lateral side at the insertion to the calcaneus    Plan:     H&P and x-rays reviewed. Today I did a careful injection of the lateral side 3 mg excellent some Kenalog 5 mg Xylocaine and dispensed a air fracture walker with instructions on usage. Before doing injection I did explain risk of rupture and other complications associated with the procedure

## 2014-10-13 NOTE — Progress Notes (Signed)
   Subjective:    Patient ID: Brent BankerMichael S Marsland Sr., male    DOB: May 09, 1946, 69 y.o.   MRN: [id number]  HPI PT STATED SEEING DR. Lavella HammockWAYNER  FOR THE RT AND NEED SECOND OPINION FOOT BACK OF THE HEEL STILL PAINFUL FOR 2 MONTHS. THE FOOT IS BEEN THE SAME BUT WHEN PUTTING PRESSURE OR FLEXING IT GET WORSE. TRIED VOLTAREN GEL AND ADVIL, TYLENOL BUT TEMPORARY RELIEF.   Review of Systems  HENT: Positive for sinus pressure.        Objective:   Physical Exam        Assessment & Plan:

## 2014-10-13 NOTE — Patient Instructions (Signed)

## 2014-10-28 ENCOUNTER — Telehealth: Payer: Self-pay | Admitting: *Deleted

## 2014-10-28 NOTE — Telephone Encounter (Addendum)
Pt states his boot is tearing, and he would like to see if he could get another.  I told pt I would exchange it if he would come in.  Pt states he will be able to come in Monday.  Pt presented to office with torn large air fracture walker.  I refitted pt with air fracture walker.

## 2014-11-07 ENCOUNTER — Ambulatory Visit: Payer: Medicare Other | Admitting: Podiatry

## 2014-11-08 ENCOUNTER — Encounter: Payer: Self-pay | Admitting: Podiatry

## 2014-11-08 ENCOUNTER — Ambulatory Visit (INDEPENDENT_AMBULATORY_CARE_PROVIDER_SITE_OTHER): Payer: Medicare Other | Admitting: Podiatry

## 2014-11-08 VITALS — BP 164/85 | HR 67 | Resp 18

## 2014-11-08 DIAGNOSIS — M7661 Achilles tendinitis, right leg: Secondary | ICD-10-CM | POA: Diagnosis not present

## 2014-11-08 NOTE — Progress Notes (Signed)
Subjective:     Patient ID: Brent BankerMichael S Marchi Sr., male   DOB: 1946/03/27, 69 y.o.   MRN: [id number]  HPI patient states that my right Achilles is doing much better and I still been wearing the boot almost full-time   Review of Systems     Objective:   Physical Exam Neurovascular status intact muscle strength adequate range of motion within normal limits with significant diminishment of discomfort posterior aspect right heel lateral side with good muscle strength upon testing    Assessment:     Improved Achilles tendinitis right    Plan:     Advised on anti-inflammatories and boot usage which will gradually be reduced over the next several weeks. Begin physical therapy which was reviewed with patient and he will be seen back if symptoms warrant

## 2015-02-13 DIAGNOSIS — H2513 Age-related nuclear cataract, bilateral: Secondary | ICD-10-CM | POA: Diagnosis not present

## 2015-02-13 DIAGNOSIS — H10523 Angular blepharoconjunctivitis, bilateral: Secondary | ICD-10-CM | POA: Diagnosis not present

## 2015-02-13 DIAGNOSIS — H16252 Phlyctenular keratoconjunctivitis, left eye: Secondary | ICD-10-CM | POA: Diagnosis not present

## 2015-05-29 ENCOUNTER — Encounter: Payer: Self-pay | Admitting: Internal Medicine

## 2015-07-18 DIAGNOSIS — H109 Unspecified conjunctivitis: Secondary | ICD-10-CM | POA: Diagnosis not present

## 2015-07-18 DIAGNOSIS — H16212 Exposure keratoconjunctivitis, left eye: Secondary | ICD-10-CM | POA: Diagnosis not present

## 2015-07-18 DIAGNOSIS — H10432 Chronic follicular conjunctivitis, left eye: Secondary | ICD-10-CM | POA: Diagnosis not present

## 2015-07-18 DIAGNOSIS — H2512 Age-related nuclear cataract, left eye: Secondary | ICD-10-CM | POA: Diagnosis not present

## 2015-08-18 DIAGNOSIS — H109 Unspecified conjunctivitis: Secondary | ICD-10-CM | POA: Diagnosis not present

## 2015-08-18 DIAGNOSIS — H10432 Chronic follicular conjunctivitis, left eye: Secondary | ICD-10-CM | POA: Diagnosis not present

## 2015-08-18 DIAGNOSIS — H0289 Other specified disorders of eyelid: Secondary | ICD-10-CM | POA: Diagnosis not present

## 2015-08-18 DIAGNOSIS — H2512 Age-related nuclear cataract, left eye: Secondary | ICD-10-CM | POA: Diagnosis not present

## 2015-09-29 DIAGNOSIS — H0289 Other specified disorders of eyelid: Secondary | ICD-10-CM | POA: Diagnosis not present

## 2015-09-29 DIAGNOSIS — H2512 Age-related nuclear cataract, left eye: Secondary | ICD-10-CM | POA: Diagnosis not present

## 2015-09-29 DIAGNOSIS — H10432 Chronic follicular conjunctivitis, left eye: Secondary | ICD-10-CM | POA: Diagnosis not present

## 2015-09-29 DIAGNOSIS — H109 Unspecified conjunctivitis: Secondary | ICD-10-CM | POA: Diagnosis not present

## 2015-11-10 DIAGNOSIS — H2512 Age-related nuclear cataract, left eye: Secondary | ICD-10-CM | POA: Diagnosis not present

## 2015-11-10 DIAGNOSIS — H10432 Chronic follicular conjunctivitis, left eye: Secondary | ICD-10-CM | POA: Diagnosis not present

## 2015-11-10 DIAGNOSIS — H109 Unspecified conjunctivitis: Secondary | ICD-10-CM | POA: Diagnosis not present

## 2015-11-10 DIAGNOSIS — H0289 Other specified disorders of eyelid: Secondary | ICD-10-CM | POA: Diagnosis not present

## 2016-02-16 DIAGNOSIS — H2511 Age-related nuclear cataract, right eye: Secondary | ICD-10-CM | POA: Diagnosis not present

## 2016-02-16 DIAGNOSIS — H2512 Age-related nuclear cataract, left eye: Secondary | ICD-10-CM | POA: Diagnosis not present

## 2016-02-16 DIAGNOSIS — H0289 Other specified disorders of eyelid: Secondary | ICD-10-CM | POA: Diagnosis not present

## 2016-02-16 DIAGNOSIS — H25812 Combined forms of age-related cataract, left eye: Secondary | ICD-10-CM | POA: Diagnosis not present

## 2016-02-16 DIAGNOSIS — H109 Unspecified conjunctivitis: Secondary | ICD-10-CM | POA: Diagnosis not present

## 2016-02-16 DIAGNOSIS — H10432 Chronic follicular conjunctivitis, left eye: Secondary | ICD-10-CM | POA: Diagnosis not present

## 2016-07-02 ENCOUNTER — Ambulatory Visit (INDEPENDENT_AMBULATORY_CARE_PROVIDER_SITE_OTHER): Payer: Medicare Other | Admitting: Physician Assistant

## 2016-07-02 ENCOUNTER — Telehealth: Payer: Self-pay

## 2016-07-02 VITALS — BP 160/80 | HR 61 | Temp 97.5°F | Resp 16 | Ht 67.5 in | Wt 238.0 lb

## 2016-07-02 DIAGNOSIS — Z23 Encounter for immunization: Secondary | ICD-10-CM | POA: Diagnosis not present

## 2016-07-02 DIAGNOSIS — R030 Elevated blood-pressure reading, without diagnosis of hypertension: Secondary | ICD-10-CM

## 2016-07-02 DIAGNOSIS — J329 Chronic sinusitis, unspecified: Secondary | ICD-10-CM | POA: Diagnosis not present

## 2016-07-02 MED ORDER — AMOXICILLIN-POT CLAVULANATE 875-125 MG PO TABS: 1.0000 | ORAL_TABLET | Freq: Two times a day (BID) | ORAL | 0 refills | Status: DC

## 2016-07-02 NOTE — Telephone Encounter (Signed)
Pt was seen on 07/03/15 for Chronic sinusitis, unspecified location +2 more by Deliah BostonMichael Clark. He was told he would have [facility]in [facility] [address]. CB # [phone]

## 2016-07-02 NOTE — Progress Notes (Addendum)
07/02/2016 11:18 AM   DOB: September 22, 1945 / MRN: [id number]  SUBJECTIVE:  Brent BankerMichael S Bebeau Sr. is a 71 y.o. male presenting for runy nose that started 6 weeks ago. Reports that his symptoms have waxed and waned and he associates nasal congestion and sneezing.  He denies fever, cough, ear pain.  He has tried mucinex, afrin and pseudoephedrine.   With regard to the afrin he started the about 3 weeks ago and is not taking daily.  He has not tried any antihistamines persistently.   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  He was told by Dr. Milus GlazierLauenstein this due to some upper back and neck soreness three weeks after the flu shot.  He unequivocally denies any weakness and paresthesia in at the initial presentation nor did he ever develop this.   He is allergic to codeine.   He  has no past medical history on file.    He  reports that he has never smoked. He does not have any smokeless tobacco history on file. He reports that he drinks about 1.2 oz of alcohol per week . He reports that he does not use drugs. He  reports that he currently engages in sexual activity. The patient  has a past surgical history that includes Anterior cruciate ligament repair; Rotator cuff repair; and Foot surgery.  His family history includes Alzheimer's disease in his father; Asthma in his mother; Diabetes in his paternal grandmother; Stroke in his maternal grandfather.  Review of Systems  Constitutional: Negative for chills and fever.  HENT: Positive for congestion. Negative for nosebleeds and sore throat.   Respiratory: Negative for cough and shortness of breath.   Cardiovascular: Negative for chest pain.  Musculoskeletal: Negative for myalgias.  Skin: Negative for itching and rash.  Neurological: Negative for dizziness and headaches.    The problem list and medications were reviewed and updated by myself where necessary and exist elsewhere in the encounter.   OBJECTIVE:  BP (!)  160/80 (BP Location: Right Arm, Patient Position: Sitting, Cuff Size: Large)   Pulse 61   Temp 97.5 F (36.4 C) (Oral)   Resp 16   Ht 5' 7.5" (1.715 m)   Wt 238 lb (108 kg)   SpO2 97%   BMI 36.73 kg/m   Physical Exam  Constitutional: He is oriented to person, place, and time. He appears well-developed.  HENT:  Right Ear: Tympanic membrane normal.  Left Ear: Tympanic membrane normal.  Nose: Right sinus exhibits no maxillary sinus tenderness and no frontal sinus tenderness. Left sinus exhibits no maxillary sinus tenderness and no frontal sinus tenderness.  Mouth/Throat: Uvula is midline, oropharynx is clear and moist and mucous membranes are normal.  Cardiovascular: Normal rate, regular rhythm and normal heart sounds.   Pulmonary/Chest: Effort normal and breath sounds normal.  Musculoskeletal: Normal range of motion. He exhibits no edema.  Neurological: He is alert and oriented to person, place, and time.  Skin: Skin is warm and dry. He is not diaphoretic.  Psychiatric: His behavior is normal.    No results found for this or any previous visit (from the past 72 hour(s)).  No results found.  ASSESSMENT AND PLAN  Brent Larsen was seen today for cough and sinusitis.  Diagnoses and all orders for this visit:  Chronic sinusitis, unspecified location: He has had 6 weeks of symptoms. Started him on Augmentin.  I have advised that he try to stop the decongestants in time and  add claritin to his regimen.   Needs flu shot: He did not have any cardinal features of Guillain Barre during his previous presentation.  The flu shot may save his life and he had had flu shots before that visit and never had any complications.  We both agree that the flu shot should be administered today.  Elevated blood pressure reading: Asymptomatic today.  He will check his bp daily for the next seven days and deliver those results to me.      The patient is advised to call or return to clinic if he does not see  an improvement in symptoms, or to seek the care of the closest emergency department if he worsens with the above plan.   Deliah Boston, MHS, PA-C [facility] 07/02/2016 11:18 AM

## 2016-07-02 NOTE — Patient Instructions (Signed)
     IF you received an x-ray today, you will receive an invoice from [facility]. Please contact [facility] at [phone] with questions or concerns regarding your invoice.   IF you received labwork today, you will receive an invoice from [facility]. Please contact [facility] at [phone] with questions or concerns regarding your invoice.   Our billing staff will not be able to assist you with questions regarding bills from these companies.  You will be contacted with the lab results as soon as they are available. The fastest way to get your results is to activate your My Chart account. Instructions are located on the last page of this paperwork. If you have not heard from us regarding the results in 2 weeks, please contact this office.     

## 2016-07-02 NOTE — Telephone Encounter (Signed)
Ive called the patient and apologized for the inconvenience.  Med waiting at pharmacy. Deliah BostonMichael Clark, MS, PA-C 7:44 PM, 07/02/2016

## 2016-07-02 NOTE — Telephone Encounter (Signed)
Were you sending in a antibiotic for patient?

## 2016-07-02 NOTE — Addendum Note (Signed)
Addended by: Ofilia NeasLARK, Schawn L on: 07/02/2016 07:45 PM   Modules accepted: Orders

## 2017-02-06 DIAGNOSIS — H25811 Combined forms of age-related cataract, right eye: Secondary | ICD-10-CM | POA: Diagnosis not present

## 2017-02-06 DIAGNOSIS — H10413 Chronic giant papillary conjunctivitis, bilateral: Secondary | ICD-10-CM | POA: Diagnosis not present

## 2017-02-06 DIAGNOSIS — Z961 Presence of intraocular lens: Secondary | ICD-10-CM | POA: Diagnosis not present

## 2017-04-18 ENCOUNTER — Ambulatory Visit (INDEPENDENT_AMBULATORY_CARE_PROVIDER_SITE_OTHER): Payer: Medicare Other | Admitting: Physician Assistant

## 2017-04-18 DIAGNOSIS — Z23 Encounter for immunization: Secondary | ICD-10-CM | POA: Diagnosis not present

## 2018-05-04 ENCOUNTER — Encounter: Payer: Self-pay | Admitting: Family Medicine

## 2018-05-04 ENCOUNTER — Ambulatory Visit: Payer: Medicare Other | Admitting: Family Medicine

## 2018-05-04 VITALS — BP 158/90 | HR 58 | Temp 98.0°F | Resp 16 | Ht 68.5 in | Wt 250.0 lb

## 2018-05-04 DIAGNOSIS — R21 Rash and other nonspecific skin eruption: Secondary | ICD-10-CM

## 2018-05-04 DIAGNOSIS — R030 Elevated blood-pressure reading, without diagnosis of hypertension: Secondary | ICD-10-CM | POA: Diagnosis not present

## 2018-05-04 MED ORDER — TRIAMCINOLONE ACETONIDE 0.1 % EX CREA: 1.0000 "application " | TOPICAL_CREAM | Freq: Two times a day (BID) | CUTANEOUS | 0 refills | Status: DC

## 2018-05-04 NOTE — Progress Notes (Signed)
   11/4/20192:45 PM  Sherilyn Banker Sr. [birth year], 72 y.o. male [id number]  Chief Complaint  Patient presents with  . Rash    HPI:   Patient is a 72 y.o. male who presents today for rash  Rash a week ago Along right side of neck, back of neck Not itchy, not tender A bit tingling at the beginning Tried OTC hydrocortisone and topical abx - did not help No fever, chills, recent illnesses  Fall Risk  07/02/2016  Falls in the past year? No     Depression screen [facility] 2/9 05/04/2018 07/02/2016  Decreased Interest 0 0  Down, Depressed, Hopeless 0 0  PHQ - 2 Score 0 0    Allergies  Allergen Reactions  . Codeine     Prior to Admission medications   Medication Sig Start Date End Date Taking? Authorizing Provider  acetaminophen (TYLENOL) 500 MG tablet Take 200 mg by mouth every 6 (six) hours as needed.   Yes [provider]  fluticasone (FLONASE) 50 MCG/ACT nasal spray 2 spr each nostril hs 06/21/13  Yes Tonye Pearson, MD  ibuprofen (ADVIL,MOTRIN) 200 MG tablet Take 200 mg by mouth every 6 (six) hours as needed.   Yes [provider]  LOTEMAX 0.5 % GEL  07/15/14   [provider]  Naphazoline-Pheniramine (OPCON-A) 0.027-0.315 % SOLN Apply to eye.    [provider]  [facility] ophthalmic ointment  07/18/14   [provider]    History reviewed. No pertinent past medical history.  Past Surgical History:  Procedure Laterality Date  . ANTERIOR CRUCIATE LIGAMENT REPAIR    . FOOT SURGERY    . ROTATOR CUFF REPAIR      Social History   Tobacco Use  . Smoking status: Never Smoker  . Smokeless tobacco: Former Engineer, water Use Topics  . Alcohol use: Yes    Alcohol/week: 2.0 standard drinks    Types: 2 Cans of beer per week    Family History  Problem Relation Age of Onset  . Asthma Mother   . Alzheimer's disease Father   . Stroke Maternal Grandfather   . Diabetes Paternal Grandmother     ROS Per  hpi  OBJECTIVE:  Blood pressure (!) 158/90, pulse (!) 58, temperature 98 F (36.7 C), temperature source Oral, resp. rate 16, height 5' 8.5" (1.74 m), weight 250 lb (113.4 kg), SpO2 96 %. Body mass index is 37.46 kg/m.   Physical Exam  Constitutional: He is oriented to person, place, and time. He appears well-developed and well-nourished.  HENT:  Head: Normocephalic and atraumatic.  Mouth/Throat: Oropharynx is clear and moist.  Eyes: Pupils are equal, round, and reactive to light. Conjunctivae and EOM are normal.  Neck: Neck supple.  Pulmonary/Chest: Effort normal.  Neurological: He is alert and oriented to person, place, and time.  Skin: Skin is warm and dry. Rash (scaterred dermatomal erythematous plaques some coalescing, some with his central vesicles) noted.  Psychiatric: He has a normal mood and affect.  Nursing note and vitals reviewed.   ASSESSMENT and PLAN  1. Rash and nonspecific skin eruption - Herpes simplex virus culture  2. Elevated BP without diagnosis of hypertension Patient reports BP is high when he comes in to the doctor - Care order/instruction:  Return if symptoms worsen or fail to improve.    Myles Lipps, MD Primary Care at [facility] [address] [place], [place] [address] Ph.  [phone] Fax [phone]

## 2018-05-04 NOTE — Patient Instructions (Addendum)
Please return in about 2 weeks for a nurse BP check  thanks  How to Take Your Blood Pressure Blood pressure is a measurement of how strongly your blood is pressing against the walls of your arteries. Arteries are blood vessels that carry blood from your heart throughout your body. Your health care provider takes your blood pressure at each office visit. You can also take your own blood pressure at home with a blood pressure machine. You may need to take your own blood pressure:  To confirm a diagnosis of high blood pressure (hypertension).  To monitor your blood pressure over time.  To make sure your blood pressure medicine is working.  Supplies needed: To take your blood pressure, you will need a blood pressure machine. You can buy a blood pressure machine, or blood pressure monitor, at most drugstores or online. There are several types of home blood pressure monitors. When choosing one, consider the following:  Choose a monitor that has an arm cuff.  Choose a monitor that wraps snugly around your upper arm. You should be able to fit only one finger between your arm and the cuff.  Do not choose a monitor that measures your blood pressure from your wrist or finger.  Your health care provider can suggest a reliable monitor that will meet your needs. How to prepare To get the most accurate reading, avoid the following for 30 minutes before you check your blood pressure:  Drinking caffeine.  Drinking alcohol.  Eating.  Smoking.  Exercising.  Five minutes before you check your blood pressure:  Empty your bladder.  Sit quietly without talking in a dining chair, rather than in a soft couch or armchair.  How to take your blood pressure To check your blood pressure, follow the instructions in the manual that came with your blood pressure monitor. If you have a digital blood pressure monitor, the instructions may be as follows: 1. Sit up straight. 2. Place your feet on the floor.  Do not cross your ankles or legs. 3. Rest your left arm at the level of your heart on a table or desk or on the arm of a chair. 4. Pull up your shirt sleeve. 5. Wrap the blood pressure cuff around the upper part of your left arm, 1 inch (2.5 cm) above your elbow. It is best to wrap the cuff around bare skin. 6. Fit the cuff snugly around your arm. You should be able to place only one finger between the cuff and your arm. 7. Position the cord inside the groove of your elbow. 8. Press the power button. 9. Sit quietly while the cuff inflates and deflates. 10. Read the digital reading on the monitor screen and write it down (record it). 11. Wait 2-3 minutes, then repeat the steps, starting at step 1.  What does my blood pressure reading mean? A blood pressure reading consists of a higher number over a lower number. Ideally, your blood pressure should be below 120/80. The first ("top") number is called the systolic pressure. It is a measure of the pressure in your arteries as your heart beats. The second ("bottom") number is called the diastolic pressure. It is a measure of the pressure in your arteries as the heart relaxes. Blood pressure is classified into four stages. The following are the stages for adults who do not have a short-term serious illness or a chronic condition. Systolic pressure and diastolic pressure are measured in a unit called mm Hg. Normal  Systolic pressure:  below 120.  Diastolic pressure: below 80. Elevated  Systolic pressure: 120-129.  Diastolic pressure: below 80. Hypertension stage 1  Systolic pressure: 130-139.  Diastolic pressure: 80-89. Hypertension stage 2  Systolic pressure: 140 or above.  Diastolic pressure: 90 or above. You can have prehypertension or hypertension even if only the systolic or only the diastolic number in your reading is higher than normal. Follow these instructions at home:  Check your blood pressure as often as recommended by your  health care provider.  Take your monitor to the next appointment with your health care provider to make sure: ? That you are using it correctly. ? That it provides accurate readings.  Be sure you understand what your goal blood pressure numbers are.  Tell your health care provider if you are having any side effects from blood pressure medicine. Contact a health care provider if:  Your blood pressure is consistently high. Get help right away if:  Your systolic blood pressure is higher than 180.  Your diastolic blood pressure is higher than 110. This information is not intended to replace advice given to you by your health care provider. Make sure you discuss any questions you have with your health care provider. Document Released: 11/24/2015 Document Revised: 02/06/2016 Document Reviewed: 11/24/2015 Elsevier Interactive Patient Education  [employer].    If you have lab work done today you will be contacted with your lab results within the next 2 weeks.  If you have not heard from [place] then please contact us. The fastest way to get your results is to register for My Chart.   IF you received an x-ray today, you will receive an invoice from [facility]. Please contact [facility] at [phone] with questions or concerns regarding your invoice.   IF you received labwork today, you will receive an invoice from [facility]. Please contact [facility] at [phone] with questions or concerns regarding your invoice.   Our billing staff will not be able to assist you with questions regarding bills from these companies.  You will be contacted with the lab results as soon as they are available. The fastest way to get your results is to activate your My Chart account. Instructions are located on the last page of this paperwork. If you have not heard from [place] regarding the results in 2 weeks, please contact this office.

## 2018-05-06 LAB — HERPES SIMPLEX VIRUS CULTURE

## 2018-05-08 ENCOUNTER — Telehealth: Payer: Self-pay | Admitting: Family Medicine

## 2018-05-08 NOTE — Telephone Encounter (Signed)
Copied from CRM [id number]. Topic: General - Other >> May 08, 2018  2:56 PM Marylen Ponto wrote: Reason for CRM: Pt states he had an appt on 05/04/18 for a rash and he would like the results. Pt also would like to know if he should just get the regular flu vaccine since the high dose was not available. Pt requests call back. Cb# [phone]

## 2018-05-11 ENCOUNTER — Ambulatory Visit (INDEPENDENT_AMBULATORY_CARE_PROVIDER_SITE_OTHER): Payer: Medicare Other

## 2018-05-11 DIAGNOSIS — Z23 Encounter for immunization: Secondary | ICD-10-CM

## 2018-05-11 NOTE — Telephone Encounter (Signed)
Pt advised of lab results per Dr. Leretha Pol. Pt advised that the office has High dose flu.

## 2019-03-26 ENCOUNTER — Ambulatory Visit (INDEPENDENT_AMBULATORY_CARE_PROVIDER_SITE_OTHER): Payer: Medicare Other | Admitting: Family Medicine

## 2019-03-26 ENCOUNTER — Other Ambulatory Visit: Payer: Self-pay

## 2019-03-26 DIAGNOSIS — Z23 Encounter for immunization: Secondary | ICD-10-CM

## 2020-05-31 ENCOUNTER — Ambulatory Visit: Payer: Self-pay | Admitting: *Deleted

## 2020-05-31 NOTE — Telephone Encounter (Signed)
Summary: J&J vaccine    Pt had J&J for vaccine but wanted info about getting J&J booster or switching to another vaccine for his booster / please advise     Patient has questions about the J&J booster- he states he has had trouble finding it- patient scheduled for booster - J&J.  Reason for Disposition . Health Information question, no triage required and triager able to answer question  Protocols used: INFORMATION ONLY CALL - NO TRIAGE-A-AH

## 2020-06-02 ENCOUNTER — Ambulatory Visit: Payer: Self-pay | Attending: Internal Medicine

## 2020-06-02 DIAGNOSIS — Z23 Encounter for immunization: Secondary | ICD-10-CM

## 2020-06-02 NOTE — Progress Notes (Signed)
   [id number]  Name:  Brent PACKMAN Sr.    MRN: [id number] DOB: Jul 07, 1945  06/02/2020  Mr. Brent Larsen was observed post Covid-19 immunization for 15 minutes without incident. He was provided with Vaccine Information Sheet and instruction to access the V-Safe system.   Mr. Brent Larsen was instructed to call 911 with any severe reactions post vaccine: Marland Kitchen Difficulty breathing  . Swelling of face and throat  . A fast heartbeat  . A bad rash all over body  . Dizziness and weakness   Immunizations Administered    Name Date Dose VIS Date Route   JANSSEN COVID-19 VACCINE 06/02/2020  3:26 PM 0.5 mL 04/19/2020 Intramuscular   Manufacturer: [employer]   Lot: [id number]   NDC: [phone]

## 2020-09-18 ENCOUNTER — Other Ambulatory Visit: Payer: Self-pay

## 2020-09-18 ENCOUNTER — Emergency Department (HOSPITAL_BASED_OUTPATIENT_CLINIC_OR_DEPARTMENT_OTHER)
Admission: EM | Admit: 2020-09-18 | Discharge: 2020-09-18 | Disposition: A | Discharge status: Home / Self Care | Payer: Medicare Other | Attending: Emergency Medicine | Admitting: Emergency Medicine

## 2020-09-18 ENCOUNTER — Encounter (HOSPITAL_BASED_OUTPATIENT_CLINIC_OR_DEPARTMENT_OTHER): Payer: Self-pay | Admitting: *Deleted

## 2020-09-18 ENCOUNTER — Emergency Department (HOSPITAL_BASED_OUTPATIENT_CLINIC_OR_DEPARTMENT_OTHER): Payer: Medicare Other

## 2020-09-18 DIAGNOSIS — Z23 Encounter for immunization: Secondary | ICD-10-CM | POA: Diagnosis not present

## 2020-09-18 DIAGNOSIS — S61111A Laceration without foreign body of right thumb with damage to nail, initial encounter: Secondary | ICD-10-CM | POA: Insufficient documentation

## 2020-09-18 DIAGNOSIS — W230XXA Caught, crushed, jammed, or pinched between moving objects, initial encounter: Secondary | ICD-10-CM | POA: Insufficient documentation

## 2020-09-18 DIAGNOSIS — S6991XA Unspecified injury of right wrist, hand and finger(s), initial encounter: Secondary | ICD-10-CM | POA: Diagnosis present

## 2020-09-18 MED ORDER — TETANUS-DIPHTH-ACELL PERTUSSIS 5-2.5-18.5 LF-MCG/0.5 IM SUSY
0.5000 mL | PREFILLED_SYRINGE | Freq: Once | INTRAMUSCULAR | Status: AC
  Administered 2020-09-18: 0.5 mL via INTRAMUSCULAR
  Filled 2020-09-18: qty 0.5

## 2020-09-18 MED ORDER — LIDOCAINE HCL (PF) 1 % IJ SOLN
5.0000 mL | Freq: Once | INTRAMUSCULAR | Status: AC
  Administered 2020-09-18: 5 mL
  Filled 2020-09-18: qty 5

## 2020-09-18 MED ORDER — CEPHALEXIN 500 MG PO CAPS: 500.0000 mg | ORAL_CAPSULE | Freq: Four times a day (QID) | ORAL | 0 refills | Status: AC

## 2020-09-18 MED ORDER — CEPHALEXIN 250 MG PO CAPS
500.0000 mg | ORAL_CAPSULE | Freq: Once | ORAL | Status: AC
  Administered 2020-09-18: 500 mg via ORAL
  Filled 2020-09-18: qty 2

## 2020-09-18 NOTE — ED Triage Notes (Signed)
Right thumb injury. His finger was caught in a [employer] fundraiser. Crush injury with laceration.

## 2020-09-18 NOTE — ED Provider Notes (Signed)
[facility] EMERGENCY DEPARTMENT Provider Note   CSN: [id number] Arrival date & time: 09/18/20  1639     History Chief Complaint  Patient presents with  . Finger Injury  . Laceration    Brent DOCKEN Sr. is a 75 y.o. male.  75 year old male presents with injury to the right thumb/nail. Patient states just prior to arrival today he was moving his lawnmower when his thumb got crushed between the handle bar and a bracket. Bleeding is controlled with dressing. Last td unknown. Patient is right hand dominant. No other injuries.         History reviewed. No pertinent past medical history.  Patient Active Problem List   Diagnosis Date Noted  . Knee pain, right 07/26/2014  . Pain in joint, ankle and foot 07/26/2014  . Pes planus of both feet 07/26/2014  . Obesity 07/26/2014    Past Surgical History:  Procedure Laterality Date  . ANTERIOR CRUCIATE LIGAMENT REPAIR    . FOOT SURGERY    . ROTATOR CUFF REPAIR         Family History  Problem Relation Age of Onset  . Asthma Mother   . Alzheimer's disease Father   . Stroke Maternal Grandfather   . Diabetes Paternal Grandmother     Social History   Tobacco Use  . Smoking status: Never Smoker  . Smokeless tobacco: Former Clinical biochemist  . Vaping Use: Never used  Substance Use Topics  . Alcohol use: Yes    Alcohol/week: 2.0 standard drinks    Types: 2 Cans of beer per week  . Drug use: No    Home Medications Prior to Admission medications   Medication Sig Start Date End Date Taking? Authorizing Provider  cephALEXin (KEFLEX) 500 MG capsule Take 1 capsule (500 mg total) by mouth 4 (four) times daily for 7 days. 09/18/20 09/25/20 Yes Jeannie Fend, PA-C  acetaminophen (TYLENOL) 500 MG tablet Take 200 mg by mouth every 6 (six) hours as needed.    [provider]  azelastine (OPTIVAR) 0.05 % ophthalmic solution Place 1 drop into both eyes 2 (two) times daily. Patient not taking: No sig reported  10/27/13   Collene Gobble, MD  fluticasone [facility]) 50 MCG/ACT nasal spray 2 spr each nostril hs 06/21/13   Tonye Pearson, MD  ibuprofen (ADVIL,MOTRIN) 200 MG tablet Take 200 mg by mouth every 6 (six) hours as needed.    [provider]  LOTEMAX 0.5 % GEL  07/15/14   [provider]  Naphazoline-Pheniramine 0.027-0.315 % SOLN Apply to eye.    [provider]  [facility] ophthalmic ointment  07/18/14   [provider]  triamcinolone cream (KENALOG) 0.1 % Apply 1 application topically 2 (two) times daily. 05/04/18   Noni Saupe, MD    Allergies    Codeine  Review of Systems   Review of Systems  Constitutional: Negative for fever.  Musculoskeletal: Positive for arthralgias.  Skin: Positive for wound.  Allergic/Immunologic: Negative for immunocompromised state.  Neurological: Negative for weakness and numbness.  Hematological: Does not bruise/bleed easily.  Psychiatric/Behavioral: Negative for confusion.  All other systems reviewed and are negative.   Physical Exam Updated Vital Signs BP (!) 147/72 (BP Location: Left Arm)   Pulse 62   Temp 98.7 F (37.1 C) (Oral)   Resp 18   Ht 5' 8.5" (1.74 m)   Wt 105.2 kg   SpO2 95%   BMI 34.76 kg/m  Physical Exam Vitals and nursing note reviewed.  Constitutional:      General: He is not in acute distress.    Appearance: He is well-developed. He is not diaphoretic.  HENT:     Head: Normocephalic and atraumatic.  Cardiovascular:     Pulses: Normal pulses.  Pulmonary:     Effort: Pulmonary effort is normal.  Musculoskeletal:        General: Swelling and tenderness present. No deformity.     Comments: Laceration to the distal right thumb involving the nailbed, extending around to ulnar/medial aspect of the digit. Sensation intact, normal ROM.   Skin:    General: Skin is warm and dry.     Findings: No erythema or rash.  Neurological:     Mental Status: He is alert and oriented to  person, place, and time.     Sensory: No sensory deficit.     Motor: No weakness.  Psychiatric:        Behavior: Behavior normal.         ED Results / Procedures / Treatments   Labs (all labs ordered are listed, but only abnormal results are displayed) Labs Reviewed - No data to display  EKG None  Radiology DG Finger Thumb Right  Result Date: 09/18/2020 CLINICAL DATA:  Rule out open fracture. Injury to some after thumb became caught in lawnmower handle. EXAM: RIGHT THUMB 2+V COMPARISON:  None FINDINGS: Signs of soft tissue injury about the thumb, along the margin of the distal phalanx no underlying radiopaque foreign body. No signs of fracture. Soft tissue swelling throughout the thumb. Well corticated bony fragment projecting over the interphalangeal joint along the volar aspect is compatible with a small sesamoid bone. IMPRESSION: Signs of soft tissue injury about the thumb without underlying fracture or radiopaque foreign body. Electronically Signed   By: Donzetta Kohut M.D.   On: 09/18/2020 17:23    Procedures .Marland KitchenLaceration Repair  Date/Time: 09/18/2020 7:23 PM Performed by: Jeannie Fend, PA-C Authorized by: Jeannie Fend, PA-C   Consent:    Consent obtained:  Verbal   Consent given by:  Patient   Risks discussed:  Infection, need for additional repair, pain, poor cosmetic result and poor wound healing   Alternatives discussed:  No treatment and delayed treatment Universal protocol:    Procedure explained and questions answered to patient or proxy's satisfaction: yes     Relevant documents present and verified: yes     Test results available: yes     Imaging studies available: yes     Required blood products, implants, devices, and special equipment available: yes     Site/side marked: yes     Immediately prior to procedure, a time out was called: yes     Patient identity confirmed:  Verbally with patient Anesthesia:    Anesthesia method:  Nerve block   Block  needle gauge:  25 G   Block anesthetic:  Lidocaine 1% w/o epi   Block technique:  Digital block   Block injection procedure:  Anatomic landmarks palpated, introduced needle, anatomic landmarks identified and incremental injection   Block outcome:  Anesthesia achieved Laceration details:    Location:  Finger   Finger location:  R thumb   Length (cm):  2.5   Depth (mm):  5 Pre-procedure details:    Preparation:  Patient was prepped and draped in usual sterile fashion and imaging obtained to evaluate for foreign bodies Exploration:    Hemostasis achieved with:  Tourniquet  Wound exploration: wound explored through full range of motion and entire depth of wound visualized     Wound extent: no foreign bodies/material noted, no muscle damage noted and no underlying fracture noted     Contaminated: no   Treatment:    Area cleansed with:  Saline   Amount of cleaning:  Extensive   Irrigation solution:  Sterile saline   Debridement:  None Skin repair:    Repair method:  Sutures   Suture size:  6-0 (additional 3 simple interrupted sutures with nylon)   Suture material:  Chromic gut   Suture technique:  Simple interrupted   Number of sutures:  3 Approximation:    Approximation:  Close Post-procedure details:    Dressing:  Bulky dressing and splint for protection   Procedure completion:  Tolerated well, no immediate complications Comments:     Nail removed for repair of nail bed     Medications Ordered in ED Medications  cephALEXin (KEFLEX) capsule 500 mg (has no administration in time range)  Tdap (BOOSTRIX) injection 0.5 mL (0.5 mLs Intramuscular Given 09/18/20 1833)  lidocaine (PF) (XYLOCAINE) 1 % injection 5 mL (5 mLs Infiltration Given 09/18/20 1833)    ED Course  I have reviewed the triage vital signs and the nursing notes.  Pertinent labs & imaging results that were available during my care of the patient were reviewed by me and considered in my medical decision making (see  chart for details).  Clinical Course as of 09/18/20 Norlene Campbell Sep 18, 2020  4270 75 year old male with right thumb laceration as above.  X-ray negative for fracture.  Patient through the nail extending medially.  Wound anesthetized with digital block, tourniquet placed, nail removed.  Patient does have additional nail underneath the injured nail, states that he had an injury to his finger in the 1960s and has always had a double layer nail through this area. Nailbed was closed with dissolvable suture, medial aspect laceration closed with nylon.  Patient was placed in a splint for protection as well as bulky dressing.  Started on Keflex and advised to follow-up with hand surgery. [LM]  1926 Discussed possibility for poor cosmetic outcome and infection concerns.  Verbalizes understanding of discharge instructions and plan. [LM]    Clinical Course User Index [LM] Alden Hipp   MDM Rules/Calculators/A&P                          Final Clinical Impression(s) / ED Diagnoses Final diagnoses:  Laceration of right thumb without foreign body with damage to nail, initial encounter    Rx / DC Orders ED Discharge Orders         Ordered    cephALEXin (KEFLEX) 500 MG capsule  4 times daily        09/18/20 1922           Alden Hipp 09/18/20 1926    Pollyann Savoy, MD 09/18/20 2159

## 2020-09-18 NOTE — Discharge Instructions (Addendum)
Take Keflex as prescribed and complete the full course. Follow-up with hand surgery, call tomorrow to schedule an appointment. Keep wound clean and dry.

## 2020-12-30 ENCOUNTER — Emergency Department (HOSPITAL_BASED_OUTPATIENT_CLINIC_OR_DEPARTMENT_OTHER): Payer: Medicare Other

## 2020-12-30 ENCOUNTER — Emergency Department (HOSPITAL_BASED_OUTPATIENT_CLINIC_OR_DEPARTMENT_OTHER)
Admission: EM | Admit: 2020-12-30 | Discharge: 2020-12-30 | Disposition: A | Discharge status: Home / Self Care | Payer: Medicare Other | Attending: Emergency Medicine | Admitting: Emergency Medicine

## 2020-12-30 ENCOUNTER — Encounter (HOSPITAL_BASED_OUTPATIENT_CLINIC_OR_DEPARTMENT_OTHER): Payer: Self-pay

## 2020-12-30 ENCOUNTER — Other Ambulatory Visit: Payer: Self-pay

## 2020-12-30 DIAGNOSIS — Z87891 Personal history of nicotine dependence: Secondary | ICD-10-CM | POA: Diagnosis not present

## 2020-12-30 DIAGNOSIS — H8110 Benign paroxysmal vertigo, unspecified ear: Secondary | ICD-10-CM | POA: Insufficient documentation

## 2020-12-30 DIAGNOSIS — R42 Dizziness and giddiness: Secondary | ICD-10-CM | POA: Diagnosis present

## 2020-12-30 LAB — COMPREHENSIVE METABOLIC PANEL
ALT: 18 U/L (ref 0–44)
AST: 21 U/L (ref 15–41)
Albumin: 4 g/dL (ref 3.5–5.0)
Alkaline Phosphatase: 55 U/L (ref 38–126)
Anion gap: 10 (ref 5–15)
BUN: 19 mg/dL (ref 8–23)
CO2: 24 mmol/L (ref 22–32)
Calcium: 9.4 mg/dL (ref 8.9–10.3)
Chloride: 104 mmol/L (ref 98–111)
Creatinine, Ser: 0.99 mg/dL (ref 0.61–1.24)
GFR, Estimated: >60 mL/min (ref >60)
Glucose, Bld: 142 mg/dL — ABNORMAL HIGH (ref 70–99)
Potassium: 4.1 mmol/L (ref 3.5–5.1)
Sodium: 138 mmol/L (ref 135–145)
Total Bilirubin: 0.4 mg/dL (ref 0.3–1.2)
Total Protein: 7.3 g/dL (ref 6.5–8.1)

## 2020-12-30 LAB — CBC WITH DIFFERENTIAL/PLATELET
Abs Immature Granulocytes: 0.03 10*3/uL (ref 0.00–0.07)
Basophils Absolute: 0 10*3/uL (ref 0.0–0.1)
Basophils Relative: 1 %
Eosinophils Absolute: 0.1 10*3/uL (ref 0.0–0.5)
Eosinophils Relative: 1 %
HCT: 47.4 % (ref 39.0–52.0)
Hemoglobin: 15.9 g/dL (ref 13.0–17.0)
Immature Granulocytes: 0 %
Lymphocytes Relative: 29 %
Lymphs Abs: 2 10*3/uL (ref 0.7–4.0)
MCH: 30.1 pg (ref 26.0–34.0)
MCHC: 33.5 g/dL (ref 30.0–36.0)
MCV: 89.8 fL (ref 80.0–100.0)
Monocytes Absolute: 0.5 10*3/uL (ref 0.1–1.0)
Monocytes Relative: 7 %
Neutro Abs: 4.4 10*3/uL (ref 1.7–7.7)
Neutrophils Relative %: 62 %
Platelets: UNDETERMINED 10*3/uL (ref 150–400)
RBC: 5.28 MIL/uL (ref 4.22–5.81)
RDW: 13.2 % (ref 11.5–15.5)
WBC: 7.1 10*3/uL (ref 4.0–10.5)
nRBC: 0 % (ref 0.0–0.2)

## 2020-12-30 LAB — CBG MONITORING, ED: Glucose-Capillary: 148 mg/dL — ABNORMAL HIGH (ref 70–99)

## 2020-12-30 MED ORDER — MECLIZINE HCL 25 MG PO TABS: 25.0000 mg | ORAL_TABLET | Freq: Three times a day (TID) | ORAL | 0 refills | Status: DC | PRN

## 2020-12-30 MED ORDER — MECLIZINE HCL 25 MG PO TABS
25.0000 mg | ORAL_TABLET | Freq: Once | ORAL | Status: AC
  Administered 2020-12-30: 25 mg via ORAL
  Filled 2020-12-30: qty 1

## 2020-12-30 NOTE — ED Notes (Signed)
ED Provider at bedside. 

## 2020-12-30 NOTE — ED Provider Notes (Signed)
[facility] EMERGENCY DEPARTMENT Provider Note   CSN: [id number] Arrival date & time: 12/30/20  2202     History Chief Complaint  Patient presents with   Dizziness    Brent MESTA Sr. is a 75 y.o. male.  Patient is a 75 year old male presenting today with complaints of feeling dizzy, off balance.  He went to bed feeling normal last night but states that during the night he rolled over and felt like something was a little off in his head.  This morning when he tried to get out of bed he was having to hold onto the walls and felt very off balance.  He initially was sweaty but that improved in about 15 minutes.  He continues to have this feeling of off balance when he walks like things are moving but it is better than it was.  He has not had any chest pain, shortness of breath, abdominal pain.  No back pain.  No unilateral weakness or numbness.  No speech difficulty.  No vision changes.  At one point he thought he might be developing a very mild headache but denies any significant head pain now.  No neck pain.  No medications at this time other than Osteo Bi-Flex.  No recent infectious symptoms including fever, cough or congestion.  The history is provided by the patient and medical records.  Dizziness     Past Medical History:  Diagnosis Date   Dizziness     Patient Active Problem List   Diagnosis Date Noted   Knee pain, right 07/26/2014   Pain in joint, ankle and foot 07/26/2014   Pes planus of both feet 07/26/2014   Obesity 07/26/2014    Past Surgical History:  Procedure Laterality Date   ANTERIOR CRUCIATE LIGAMENT REPAIR     FOOT SURGERY     ROTATOR CUFF REPAIR         Family History  Problem Relation Age of Onset   Asthma Mother    Alzheimer's disease Father    Stroke Maternal Grandfather    Diabetes Paternal Grandmother     Social History   Tobacco Use   Smoking status: Never   Smokeless tobacco: Former  Building services engineer Use: Never used   Substance Use Topics   Alcohol use: Yes    Alcohol/week: 2.0 standard drinks    Types: 2 Cans of beer per week   Drug use: No    Home Medications Prior to Admission medications   Medication Sig Start Date End Date Taking? Authorizing Provider  acetaminophen (TYLENOL) 500 MG tablet Take 200 mg by mouth every 6 (six) hours as needed.   Yes [provider]  fluticasone (FLONASE) 50 MCG/ACT nasal spray 2 spr each nostril hs 06/21/13  Yes Tonye Pearson, MD  ibuprofen (ADVIL,MOTRIN) 200 MG tablet Take 200 mg by mouth every 6 (six) hours as needed.   Yes [provider]  azelastine (OPTIVAR) 0.05 % ophthalmic solution Place 1 drop into both eyes 2 (two) times daily. Patient not taking: No sig reported 10/27/13   Collene Gobble, MD  LOTEMAX 0.5 % GEL  07/15/14   [provider]  Naphazoline-Pheniramine 0.027-0.315 % SOLN Apply to eye.    [provider]  [facility] ophthalmic ointment  07/18/14   [provider]  triamcinolone cream (KENALOG) 0.1 % Apply 1 application topically 2 (two) times daily. 05/04/18   Noni Saupe, MD    Allergies    Codeine  Review of Systems   Review of Systems  Neurological:  Positive for dizziness.  All other systems reviewed and are negative.  Physical Exam Updated Vital Signs BP (!) 188/97   Pulse 60   Temp 98.2 F (36.8 C) (Oral)   Resp 16   Ht 5\' 8"  (1.727 m)   Wt 107 kg   SpO2 99%   BMI 35.88 kg/m   Physical Exam Vitals and nursing note reviewed.  Constitutional:      General: He is not in acute distress.    Appearance: Normal appearance. He is well-developed.  HENT:     Head: Normocephalic and atraumatic.     Right Ear: Tympanic membrane normal.     Left Ear: Tympanic membrane normal.     Mouth/Throat:     Mouth: Mucous membranes are moist.  Eyes:     Extraocular Movements: Extraocular movements intact.     Conjunctiva/sclera: Conjunctivae normal.     Pupils: Pupils are equal,  round, and reactive to light.     Comments: No notable nystagmus  Cardiovascular:     Rate and Rhythm: Normal rate and regular rhythm.     Pulses: Normal pulses.     Heart sounds: No murmur heard. Pulmonary:     Effort: Pulmonary effort is normal. No respiratory distress.     Breath sounds: Normal breath sounds. No wheezing or rales.  Abdominal:     General: There is no distension.     Palpations: Abdomen is soft.     Tenderness: There is no abdominal tenderness. There is no guarding or rebound.  Musculoskeletal:        General: No tenderness. Normal range of motion.     Cervical back: Normal range of motion and neck supple. No tenderness.     Right lower leg: No edema.     Left lower leg: No edema.  Skin:    General: Skin is warm and dry.     Capillary Refill: Capillary refill takes less than 2 seconds.     Findings: No erythema or rash.  Neurological:     Mental Status: He is alert and oriented to person, place, and time. Mental status is at baseline.     Cranial Nerves: Cranial nerves are intact. No dysarthria or facial asymmetry.     Sensory: Sensation is intact. No sensory deficit.     Motor: Motor function is intact. No weakness or pronator drift.     Coordination: Coordination is intact. Finger-Nose-Finger Test and Heel to [place] Test normal.     Gait: Gait is intact.  Psychiatric:        Mood and Affect: Mood normal.        Behavior: Behavior normal.    ED Results / Procedures / Treatments   Labs (all labs ordered are listed, but only abnormal results are displayed) Labs Reviewed  COMPREHENSIVE METABOLIC PANEL - Abnormal; Notable for the following components:      Result Value   Glucose, Bld 142 (*)    All other components within normal limits  CBG MONITORING, ED - Abnormal; Notable for the following components:   Glucose-Capillary 148 (*)    All other components within normal limits  CBC WITH DIFFERENTIAL/PLATELET    EKG EKG  Interpretation  Date/Time:  Saturday December 30 2020 09:49:09 EDT Ventricular Rate:  58 PR Interval:  152 QRS Duration: 142 QT Interval:  467 QTC Calculation: 459 R Axis:   44 Text Interpretation: Sinus rhythm Right bundle branch block  Baseline wander in lead(s) II No significant change since last tracing Confirmed by Gwyneth Sprout ([id number]) on 12/30/2020 10:00:08 AM  Radiology CT Head Wo Contrast  Result Date: 12/30/2020 CLINICAL DATA:  74 year old male with dizziness and diaphoresis. EXAM: CT HEAD WITHOUT CONTRAST TECHNIQUE: Contiguous axial images were obtained from the base of the skull through the vertex without intravenous contrast. COMPARISON:  None. FINDINGS: Brain: Suspicion of a small hyperdense colloid cyst along the anterior margin of the 3rd ventricle on series 2, image 15 and sagittal image 27, but only measuring 4-5 mm. Normal ventricle size and configuration. Superimposed disproportionate cerebral volume loss in the anterior temporal and frontal lobes (maximal in the anterior temporal lobe see coronal image 49). No superimposed midline shift, mass effect, intracranial hemorrhage or evidence of cortically based acute infarction. Gray-white matter differentiation is within normal limits throughout the brain. Vascular: Mild Calcified atherosclerosis at the skull base. Skull: Negative.  No acute osseous abnormality identified. Sinuses/Orbits: Tympanic cavities are clear. Visualized paranasal sinuses and mastoids are clear. Other: No acute orbit or scalp soft tissue finding. Postoperative changes to both globes. Mild left parietal convexity scalp scarring. IMPRESSION: 1. 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 2. 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 Electronically Signed   By: Odessa Fleming M.D.   On: 12/30/2020 10:50     Procedures Procedures   Medications Ordered in ED Medications  meclizine (ANTIVERT) tablet 25 mg (25 mg Oral Given 12/30/20 1018)    ED Course  I have reviewed the triage vital signs and the nursing notes.  Pertinent labs & imaging results that were available during my care of the patient were reviewed by me and considered in my medical decision making (see chart for details).    MDM Rules/Calculators/A&P                          Pt with sx most consistent with peripheral vertigo.  No systemic or infectious sx.  Normal neuro exam without weakness, ataxia or cerebellar findings on exam.  Normal vision.  Sx are reproducible with attempting to walk.  No hx of Stroke and low suspicion.  Pt is healthy for his age and takes no medications but does not seek medical care regularly.  Possible HTN, prior hx of hyperlipidemia but due to side effects stopped taking meds.  No tobacco or alcohol use. VS with HTN but did improve with rest. Will treat for peripheral vertigo and re-eval.  11:31 AM Patient reports the dizziness is gone.  He was able to ambulate to the bathroom without difficulty.  Blood pressure has improved but he does remain hypertensive still in the 150s.  Recommended he get a blood pressure cuff and check at home and follow-up with his doctor.  Lab work is reassuring, head CT does show a 4 to 5 mm colloid cyst without complicating features and atrophy in the temporal and frontal lobes.  Nothing to explain this patient's symptoms today.  Findings discussed with the patient and recommended PCP follow-up so this can be watched.  We will discharge patient home with return precautions.  He was given a short course of meclizine.  MDM   Amount and/or Complexity of Data Reviewed Clinical lab tests: ordered and reviewed Tests in the radiology section of CPT: ordered and reviewed Tests in the medicine section of CPT:  ordered and reviewed Independent visualization of images, tracings, or  specimens: yes     Final Clinical Impression(s) / ED Diagnoses Final diagnoses:  Benign paroxysmal positional vertigo, unspecified laterality    Rx / DC Orders ED Discharge Orders          Ordered    meclizine (ANTIVERT) 25 MG tablet  3 times daily PRN        12/30/20 1129             Gwyneth SproutPlunkett, Tory Mckissack, MD 12/30/20 1133

## 2020-12-30 NOTE — Discharge Instructions (Addendum)
Use the medication as needed if you are dizzy.  If you develop weakness on 1 side, trouble with speech, confusion, severe headache, passing out or chest pain and shortness of breath return to the ER

## 2020-12-30 NOTE — ED Triage Notes (Signed)
Awoke feeling dizzy, gait unsteady. Sweaty

## 2021-01-26 ENCOUNTER — Encounter (HOSPITAL_BASED_OUTPATIENT_CLINIC_OR_DEPARTMENT_OTHER): Payer: Self-pay

## 2021-01-26 ENCOUNTER — Emergency Department (HOSPITAL_BASED_OUTPATIENT_CLINIC_OR_DEPARTMENT_OTHER)
Admission: EM | Admit: 2021-01-26 | Discharge: 2021-01-26 | Disposition: A | Discharge status: Home / Self Care | Payer: Medicare Other | Attending: Emergency Medicine | Admitting: Emergency Medicine

## 2021-01-26 ENCOUNTER — Other Ambulatory Visit: Payer: Self-pay

## 2021-01-26 ENCOUNTER — Emergency Department (HOSPITAL_BASED_OUTPATIENT_CLINIC_OR_DEPARTMENT_OTHER): Payer: Medicare Other

## 2021-01-26 DIAGNOSIS — Z87891 Personal history of nicotine dependence: Secondary | ICD-10-CM | POA: Insufficient documentation

## 2021-01-26 DIAGNOSIS — S93402A Sprain of unspecified ligament of left ankle, initial encounter: Secondary | ICD-10-CM | POA: Insufficient documentation

## 2021-01-26 DIAGNOSIS — X501XXA Overexertion from prolonged static or awkward postures, initial encounter: Secondary | ICD-10-CM | POA: Diagnosis not present

## 2021-01-26 DIAGNOSIS — R2242 Localized swelling, mass and lump, left lower limb: Secondary | ICD-10-CM | POA: Diagnosis not present

## 2021-01-26 DIAGNOSIS — S99912A Unspecified injury of left ankle, initial encounter: Secondary | ICD-10-CM | POA: Diagnosis present

## 2021-01-26 NOTE — ED Provider Notes (Signed)
[facility] EMERGENCY DEPARTMENT Provider Note   CSN: [id number] Arrival date & time: 01/26/21  1507     History Chief Complaint  Patient presents with   Ankle Injury    Brent GANAWAY Sr. is a 75 y.o. male who presents for evaluation of left ankle pain and swelling.  He reports that about 12:30 PM, he felt a pop in his ankle and across his left ankle to twist.  He states since then he has been having pain to the lateral malleolus.  He states it hurts more when he tries to walk on it but he has been able to bear some weight and ambulate.  He states he did not fall and hit his head.  Denies any other injury. He denies any numbness/weakness.   The history is provided by the patient.      Past Medical History:  Diagnosis Date   Dizziness     Patient Active Problem List   Diagnosis Date Noted   Knee pain, right 07/26/2014   Pain in joint, ankle and foot 07/26/2014   Pes planus of both feet 07/26/2014   Obesity 07/26/2014    Past Surgical History:  Procedure Laterality Date   ANTERIOR CRUCIATE LIGAMENT REPAIR     FOOT SURGERY     ROTATOR CUFF REPAIR         Family History  Problem Relation Age of Onset   Asthma Mother    Alzheimer's disease Father    Stroke Maternal Grandfather    Diabetes Paternal Grandmother     Social History   Tobacco Use   Smoking status: Former    Types: Cigarettes   Smokeless tobacco: Former  Building services engineer Use: Never used  Substance Use Topics   Alcohol use: Yes    Alcohol/week: 2.0 standard drinks    Types: 2 Cans of beer per week    Comment: daily   Drug use: No    Home Medications Prior to Admission medications   Medication Sig Start Date End Date Taking? Authorizing Provider  acetaminophen (TYLENOL) 500 MG tablet Take 200 mg by mouth every 6 (six) hours as needed.    [provider]  azelastine (OPTIVAR) 0.05 % ophthalmic solution Place 1 drop into both eyes 2 (two) times daily. Patient not taking:  No sig reported 10/27/13   Collene Gobble, MD  fluticasone [facility]) 50 MCG/ACT nasal spray 2 spr each nostril hs 06/21/13   Tonye Pearson, MD  ibuprofen (ADVIL,MOTRIN) 200 MG tablet Take 200 mg by mouth every 6 (six) hours as needed.    [provider]  LOTEMAX 0.5 % GEL  07/15/14   [provider]  meclizine (ANTIVERT) 25 MG tablet Take 1 tablet (25 mg total) by mouth 3 (three) times daily as needed for dizziness. 12/30/20   Gwyneth Sprout, MD  Naphazoline-Pheniramine 0.027-0.315 % SOLN Apply to eye.    [provider]  [facility] ophthalmic ointment  07/18/14   [provider]  triamcinolone cream (KENALOG) 0.1 % Apply 1 application topically 2 (two) times daily. 05/04/18   Noni Saupe, MD    Allergies    Codeine  Review of Systems   Review of Systems  Musculoskeletal:        Left ankle pain  Neurological:  Negative for weakness and numbness.  All other systems reviewed and are negative.  Physical Exam Updated Vital Signs BP (!) 193/98   Pulse 66   Temp 98.5 F (  36.9 C) (Oral)   Resp 16   Ht 5\' 9"  (1.753 m)   Wt 104.8 kg   SpO2 98%   BMI 34.11 kg/m   Physical Exam Vitals and nursing note reviewed.  Constitutional:      Appearance: He is well-developed.  HENT:     Head: Normocephalic and atraumatic.  Eyes:     General: No scleral icterus.       Right eye: No discharge.        Left eye: No discharge.     Conjunctiva/sclera: Conjunctivae normal.  Cardiovascular:     Pulses:          Dorsalis pedis pulses are 2+ on the right side and 2+ on the left side.  Pulmonary:     Effort: Pulmonary effort is normal.  Musculoskeletal:     Comments: Tenderness palpation lateral malleolus of the left ankle with overlying soft tissue swelling.  No deformity or crepitus noted.  Dorsiflexion plantarflexion intact.  No deficits with palpation of the Achilles tendon.  No tenderness palpation noted to left proximal tib-fib.  He can fully  wiggle all 5 toes.  Skin:    General: Skin is warm and dry.     Comments: Good distal cap refill. LLE is not dusky in appearance or cool to touch.  Neurological:     Mental Status: He is alert.     Comments: Sensation intact along major nerve distributions of LLE.   Psychiatric:        Speech: Speech normal.        Behavior: Behavior normal.     ED Results / Procedures / Treatments   Labs (all labs ordered are listed, but only abnormal results are displayed) Labs Reviewed - No data to display  EKG None  Radiology DG Ankle Complete Left  Result Date: 01/26/2021 CLINICAL DATA:  Twisting injury left ankle at 12:30 today. Initial encounter. EXAM: LEFT ANKLE COMPLETE - 3+ VIEW COMPARISON:  None. FINDINGS: There is no acute bony or joint abnormality. Small well corticated ossicle off the medial malleolus is noted. Calcaneal spurring is seen. 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 IMPRESSION: No acute abnormality. Electronically Signed   By: 01/28/2021 M.D.   On: 01/26/2021 16:24    Procedures Procedures   Medications Ordered in ED Medications - No data to display  ED Course  I have reviewed the triage vital signs and the nursing notes.  Pertinent labs & imaging results that were available during my care of the patient were reviewed by me and considered in my medical decision making (see chart for details).    MDM Rules/Calculators/A&P                           75 year old male remains for evaluation of left ankle pain.  Reports that about 12:30 PM today he twisted it and felt a pop.  He has been able to ambulate intermittent bear weight.  No numbness/weakness.  On Arrival, he is afebrile, nontoxic-appearing.  Vital signs are stable.  On exam, he has some mild tenderness, swelling to the lateral malleolus.  Dorsiflexion plantarflexion intact.  Suspect this is likely sprain suspicion for fracture or dislocation but is a consideration.  History/physical exam not concerning  for Achilles tendon rupture.  Plan for x-ray.  X-ray shows no acute abnormality.  I discussed results with patient.  I discussed with him that there could still be a ligamentous/MSK/tendon injury.  We will  put him in an ASO splint.  Patient informed to follow-up with Ortho if no improvement. At this time, patient exhibits no emergent life-threatening condition that require further evaluation in ED. Patient had ample opportunity for questions and discussion. All patient's questions were answered with full understanding. Strict return precautions discussed. Patient expresses understanding and agreement to plan.   Portions of this note were generated with Scientist, clinical (histocompatibility and immunogenetics). Dictation errors may occur despite best attempts at proofreading.   Final Clinical Impression(s) / ED Diagnoses Final diagnoses:  Sprain of left ankle, unspecified ligament, initial encounter    Rx / DC Orders ED Discharge Orders     None        Rosana Hoes 01/26/21 1654    Virgina Norfolk, DO 01/26/21 1700

## 2021-01-26 NOTE — Discharge Instructions (Addendum)
Follow up with your Primary Care Doctor as needed.  ° °Follow up with referred orthopedic doctor in 1-2 weeks if no improvement of pain.  ° °You can take Tylenol or Ibuprofen as directed for pain. You can alternate Tylenol and Ibuprofen every 4 hours. If you take Tylenol at 1pm, then you can take Ibuprofen at 5pm. Then you can take Tylenol again at 9pm. Do not exceed 4000 mg of tylenol a day. Do not exceed 800 mg of ibuprofen a day.  ° °  °Return to the Emergency Department immediately for any worsening pain, redness/swelling of the ankle, gray or blue color to the toes, numbness/weakness of toes or foot, difficulty walking or any other worsening or concerning symptoms.  ° ° °Ankle sprain °Ankle sprain occurs when the ligaments that hold the ankle joint to get her are stretched or torn. It may take 4-6 weeks to heal. ° °For activity: Use crutches with nonweightbearing for the first few days. Then, you may walk on your ankles as the pain allows, or as instructed. Start gradually with weight bearing on the affected ankle. Once you can walk pain free, then try jogging. When you can run forwards, then you can try moving side to side. If you cannot walk without crutches in one week, you need a recheck by your Family Doctor. ° °If you do not have a family doctor to followup with, you can see the list of phone numbers below. Please call today to make a followup appointment. ° ° °RICE therapy:  Routine Care for injuries ° °Rest, Ice, Compression, Elevation (RICE) ° °Rest is needed to allow your body to heal. Routine activities can be resumed when comfortable. Injury tendons and bones can take up to 6 weeks to heal. Tendons are cordlike structures that attach muscles and bones. ° °Ice following an injury helps keep the swelling down and reduce the pain. Put ice in a plastic bag. Place a towel between your skin and the bag of ice. Leave the ice on for 15-20 minutes, 3-4 times a day. Do this while awake, for the first 24-48  hours. After that continue as directed by your caregiver. ° °Compression helps keep swelling down. It also gives support and helps with discomfort. If any lasting bandage has been applied, it should be removed and reapplied every 3-4 hours. It should not be applied tightly, but firmly enough to keep swelling down. Watch fingers or toes for swelling, discoloration, coldness, numbness or excessive pain. If any of these problems occur, removed the bandage and reapply loosely. Contact your caregiver if these problems continue. ° °Elevation helps reduce swelling and decrease your pain. With extremities such as the arms, hands, legs and feet, the injured area should be placed near or above the level of the heart if possible. ° ° ° °

## 2021-01-26 NOTE — ED Triage Notes (Signed)
Pt states he twisted left ankle ~1230pm-NAD-limping gait

## 2021-06-29 ENCOUNTER — Emergency Department (HOSPITAL_COMMUNITY): Payer: Medicare Other

## 2021-06-29 ENCOUNTER — Other Ambulatory Visit: Payer: Self-pay

## 2021-06-29 ENCOUNTER — Encounter (HOSPITAL_COMMUNITY): Payer: Self-pay

## 2021-06-29 ENCOUNTER — Emergency Department (HOSPITAL_COMMUNITY)
Admission: EM | Admit: 2021-06-29 | Discharge: 2021-06-29 | Disposition: A | Discharge status: Home / Self Care | Payer: Medicare Other | Attending: Emergency Medicine | Admitting: Emergency Medicine

## 2021-06-29 DIAGNOSIS — R0789 Other chest pain: Secondary | ICD-10-CM | POA: Diagnosis present

## 2021-06-29 DIAGNOSIS — Z20822 Contact with and (suspected) exposure to covid-19: Secondary | ICD-10-CM | POA: Diagnosis not present

## 2021-06-29 DIAGNOSIS — R1011 Right upper quadrant pain: Secondary | ICD-10-CM | POA: Diagnosis not present

## 2021-06-29 DIAGNOSIS — R079 Chest pain, unspecified: Secondary | ICD-10-CM

## 2021-06-29 DIAGNOSIS — Z87891 Personal history of nicotine dependence: Secondary | ICD-10-CM | POA: Diagnosis not present

## 2021-06-29 DIAGNOSIS — R17 Unspecified jaundice: Secondary | ICD-10-CM | POA: Diagnosis not present

## 2021-06-29 LAB — LIPASE, BLOOD: Lipase: 26 U/L (ref 11–51)

## 2021-06-29 LAB — HEPATIC FUNCTION PANEL
ALT: 21 U/L (ref 0–44)
AST: 31 U/L (ref 15–41)
Albumin: 3.7 g/dL (ref 3.5–5.0)
Alkaline Phosphatase: 49 U/L (ref 38–126)
Bilirubin, Direct: 0.7 mg/dL — ABNORMAL HIGH (ref 0.0–0.2)
Indirect Bilirubin: 1.3 mg/dL — ABNORMAL HIGH (ref 0.3–0.9)
Total Bilirubin: 2 mg/dL — ABNORMAL HIGH (ref 0.3–1.2)
Total Protein: 6.6 g/dL (ref 6.5–8.1)

## 2021-06-29 LAB — BASIC METABOLIC PANEL
Anion gap: 8 (ref 5–15)
BUN: 14 mg/dL (ref 8–23)
CO2: 24 mmol/L (ref 22–32)
Calcium: 9.2 mg/dL (ref 8.9–10.3)
Chloride: 104 mmol/L (ref 98–111)
Creatinine, Ser: 0.95 mg/dL (ref 0.61–1.24)
GFR, Estimated: >60 mL/min (ref >60)
Glucose, Bld: 123 mg/dL — ABNORMAL HIGH (ref 70–99)
Potassium: 4 mmol/L (ref 3.5–5.1)
Sodium: 136 mmol/L (ref 135–145)

## 2021-06-29 LAB — CBC
HCT: 47.2 % (ref 39.0–52.0)
Hemoglobin: 15.8 g/dL (ref 13.0–17.0)
MCH: 30.2 pg (ref 26.0–34.0)
MCHC: 33.5 g/dL (ref 30.0–36.0)
MCV: 90.1 fL (ref 80.0–100.0)
Platelets: 103 10*3/uL — ABNORMAL LOW (ref 150–400)
RBC: 5.24 MIL/uL (ref 4.22–5.81)
RDW: 13 % (ref 11.5–15.5)
WBC: 12.4 10*3/uL — ABNORMAL HIGH (ref 4.0–10.5)
nRBC: 0 % (ref 0.0–0.2)

## 2021-06-29 LAB — RESP PANEL BY RT-PCR (FLU A&B, COVID) ARPGX2
Influenza A by PCR: NEGATIVE
Influenza B by PCR: NEGATIVE
SARS Coronavirus 2 by RT PCR: NEGATIVE

## 2021-06-29 LAB — TROPONIN I (HIGH SENSITIVITY)
Troponin I (High Sensitivity): 7 ng/L (ref <18)
Troponin I (High Sensitivity): 9 ng/L (ref <18)

## 2021-06-29 MED ORDER — PANTOPRAZOLE SODIUM 20 MG PO TBEC: 20.0000 mg | DELAYED_RELEASE_TABLET | Freq: Every day | ORAL | 0 refills | Status: AC

## 2021-06-29 MED ORDER — NITROGLYCERIN 0.4 MG SL SUBL
0.4000 mg | SUBLINGUAL_TABLET | SUBLINGUAL | Status: DC | PRN
  Administered 2021-06-29: 13:00:00 0.4 mg via SUBLINGUAL
  Filled 2021-06-29: qty 1

## 2021-06-29 MED ORDER — LIDOCAINE VISCOUS HCL 2 % MT SOLN
15.0000 mL | Freq: Once | OROMUCOSAL | Status: AC
  Administered 2021-06-29: 14:00:00 15 mL via ORAL
  Filled 2021-06-29: qty 15

## 2021-06-29 MED ORDER — ASPIRIN 81 MG PO CHEW
324.0000 mg | CHEWABLE_TABLET | Freq: Once | ORAL | Status: AC
  Administered 2021-06-29: 11:00:00 324 mg via ORAL
  Filled 2021-06-29: qty 4

## 2021-06-29 MED ORDER — KETOROLAC TROMETHAMINE 15 MG/ML IJ SOLN
15.0000 mg | Freq: Once | INTRAMUSCULAR | Status: AC
  Administered 2021-06-29: 14:00:00 15 mg via INTRAVENOUS
  Filled 2021-06-29: qty 1

## 2021-06-29 MED ORDER — IOHEXOL 350 MG/ML SOLN
100.0000 mL | Freq: Once | INTRAVENOUS | Status: AC | PRN
  Administered 2021-06-29: 13:00:00 100 mL via INTRAVENOUS

## 2021-06-29 MED ORDER — ALUM & MAG HYDROXIDE-SIMETH 200-200-20 MG/5ML PO SUSP
30.0000 mL | Freq: Once | ORAL | Status: AC
  Administered 2021-06-29: 14:00:00 30 mL via ORAL
  Filled 2021-06-29: qty 30

## 2021-06-29 NOTE — ED Provider Notes (Signed)
[facility] Provider Note   CSN: [id number] Arrival date & time: 06/29/21  8144     History Chief Complaint  Patient presents with   Chest Pain    Brent ROTHGEB Sr. is a 75 y.o. male.  HPI  HPI: A 75 year old patient with a history of hypercholesterolemia and obesity presents for evaluation of chest pain. Initial onset of pain was more than 6 hours ago. The patient's chest pain is described as heaviness/pressure/tightness and is not worse with exertion. The patient's chest pain is middle- or left-sided, is not well-localized, is not sharp and does not radiate to the arms/jaw/neck. The patient does not complain of nausea and denies diaphoresis. The patient has no history of stroke, has no history of peripheral artery disease, has not smoked in the past 90 days, denies any history of treated diabetes, has no relevant family history of coronary artery disease (first degree relative at less than age 54) and is not hypertensive.  Patient reports that he was well when he went to bed last night although he did have some change in his nasal discharge to some with blood in it.  He woke up around 130 and had pain in the lower anterior chest that radiated to the right and through to his right shoulder blade.  He has had some radiation through the left.  Describes as a dull pain that is worse with 6 out of 10 and is currently 4-5.  He is not taking any interventions.  He had some chills Past Medical History:  Diagnosis Date   Dizziness     Patient Active Problem List   Diagnosis Date Noted   Knee pain, right 07/26/2014   Pain in joint, ankle and foot 07/26/2014   Pes planus of both feet 07/26/2014   Obesity 07/26/2014    Past Surgical History:  Procedure Laterality Date   ANTERIOR CRUCIATE LIGAMENT REPAIR     FOOT SURGERY     ROTATOR CUFF REPAIR         Family History  Problem Relation Age of Onset   Asthma Mother    Alzheimer's disease Father     Stroke Maternal Grandfather    Diabetes Paternal Grandmother     Social History   Tobacco Use   Smoking status: Former    Types: Cigarettes   Smokeless tobacco: Former  Scientific laboratory technician Use: Never used  Substance Use Topics   Alcohol use: Yes    Alcohol/week: 2.0 standard drinks    Types: 2 Cans of beer per week    Comment: daily   Drug use: No    Home Medications Prior to Admission medications   Medication Sig Start Date End Date Taking? Authorizing Provider  acetaminophen (TYLENOL) 500 MG tablet Take 1,000 mg by mouth every 6 (six) hours as needed for mild pain.   Yes [provider]  fluticasone (FLONASE) 50 MCG/ACT nasal spray 2 spr each nostril hs Patient taking differently: 2 sprays daily as needed for allergies. 06/21/13  Yes Leandrew Koyanagi, MD  ibuprofen (ADVIL,MOTRIN) 200 MG tablet Take 200 mg by mouth every 6 (six) hours as needed for mild pain.   Yes [provider]  Oxymetazoline HCl (SINEX ULTRA FINE MIST 12-HOUR NA) Place 1 spray into the nose every 12 (twelve) hours as needed (congestion).   Yes [provider]  pseudoephedrine (SUDAFED) 120 MG 12 hr tablet Take 120 mg by mouth daily as needed for congestion.  Yes [provider]    Allergies    Codeine  Review of Systems   Review of Systems  All other systems reviewed and are negative.  Physical Exam Updated Vital Signs BP 123/62    Pulse (!) 59    Temp 98.9 F (37.2 C) (Oral)    Resp 18    Ht 1.753 m ('5\' 9"' )    Wt 111.1 kg    SpO2 94%    BMI 36.18 kg/m   Physical Exam Vitals and nursing note reviewed.  Constitutional:      Appearance: He is well-developed.  HENT:     Head: Normocephalic and atraumatic.  Eyes:     Pupils: Pupils are equal, round, and reactive to light.  Cardiovascular:     Rate and Rhythm: Normal rate and regular rhythm.     Heart sounds: Normal heart sounds.  Pulmonary:     Effort: Pulmonary effort is normal.     Breath sounds:  Normal breath sounds.  Abdominal:     General: Bowel sounds are normal.     Palpations: Abdomen is soft.     Comments: Mild epigastric and right upper quadrant tenderness no rebound Patient has umbilical hernia that is soft and nontender patient states he has had it for many years and is unchanged  Musculoskeletal:        General: Normal range of motion.     Cervical back: Normal range of motion.  Skin:    General: Skin is warm and dry.     Capillary Refill: Capillary refill takes less than 2 seconds.  Neurological:     General: No focal deficit present.     Mental Status: He is alert.    ED Results / Procedures / Treatments   Labs (all labs ordered are listed, but only abnormal results are displayed) Labs Reviewed  BASIC METABOLIC PANEL - Abnormal; Notable for the following components:      Result Value   Glucose, Bld 123 (*)    All other components within normal limits  CBC - Abnormal; Notable for the following components:   WBC 12.4 (*)    Platelets 103 (*)    All other components within normal limits  HEPATIC FUNCTION PANEL - Abnormal; Notable for the following components:   Total Bilirubin 2.0 (*)    Bilirubin, Direct 0.7 (*)    Indirect Bilirubin 1.3 (*)    All other components within normal limits  RESP PANEL BY RT-PCR (FLU A&B, COVID) ARPGX2  LIPASE, BLOOD  TROPONIN I (HIGH SENSITIVITY)  TROPONIN I (HIGH SENSITIVITY)    EKG EKG Interpretation  Date/Time:  Friday June 29 2021 08:25:09 EST Ventricular Rate:  65 PR Interval:  146 QRS Duration: 137 QT Interval:  425 QTC Calculation: 442 R Axis:   -3 Text Interpretation: Sinus rhythm Right bundle branch block  nsst inferior leads Confirmed by Pattricia Boss [phone]) on 06/29/2021 8:31:35 AM  Radiology DG Chest Portable 1 View  Result Date: 06/29/2021 CLINICAL DATA:  Chest pain EXAM: PORTABLE CHEST 1 VIEW COMPARISON:  Chest radiograph 12/18/2007 FINDINGS: The heart is mildly enlarged. The upper mediastinal  contours are normal. Linear opacities in the lung bases are favored to reflect subsegmental atelectasis. There is no other focal consolidation. There is no pulmonary edema. There is no pleural effusion or pneumothorax There is no acute osseous abnormality. IMPRESSION: Cardiomegaly and bibasilar subsegmental atelectasis. Otherwise, no radiographic evidence of acute cardiopulmonary process. Electronically Signed   By: Court Joy.D.  On: 06/29/2021 08:38   CT Angio Chest/Abd/Pel for Dissection W and/or Wo Contrast  Result Date: 06/29/2021 CLINICAL DATA:  Chest pain. EXAM: CT ANGIOGRAPHY CHEST, ABDOMEN AND PELVIS TECHNIQUE: Non-contrast CT of the chest was initially obtained. Multidetector CT imaging through the chest, abdomen and pelvis was performed using the standard protocol during bolus administration of intravenous contrast. Multiplanar reconstructed images and MIPs were obtained and reviewed to evaluate the vascular anatomy. CONTRAST:  118m OMNIPAQUE IOHEXOL 350 MG/ML SOLN COMPARISON:  Chest x-Taim Wurm from same day. CT abdomen pelvis dated November 30, 2007. FINDINGS: CTA CHEST FINDINGS Cardiovascular: Preferential opacification of the thoracic aorta. No evidence of thoracic aortic aneurysm or dissection. Mild aortic arch and coronary artery atherosclerotic calcification. Normal heart size. No pericardial effusion. No pulmonary embolism. Mediastinum/Nodes: No enlarged mediastinal, hilar, or axillary lymph nodes. 1.0 cm hypodense nodule in the left thyroid lobe. Not clinically significant; no follow-up imaging recommended. Trachea and esophagus demonstrate no significant findings. Lungs/Pleura: No focal consolidation, pleural effusion, or pneumothorax. Musculoskeletal: No chest wall abnormality. No acute or significant osseous findings. Review of the MIP images confirms the above findings. CTA ABDOMEN AND PELVIS FINDINGS VASCULAR Aorta: Normal caliber aorta without aneurysm, dissection, vasculitis or significant  stenosis. Mild atherosclerotic calcification. Celiac: Patent without evidence of aneurysm, dissection, vasculitis or significant stenosis. Accessory left hepatic artery arising from the left gastric artery. SMA: Patent without evidence of aneurysm, dissection, vasculitis or significant stenosis. Replaced right hepatic artery arising from the SMA. Renals: Both renal arteries are patent without evidence of aneurysm, dissection, vasculitis, fibromuscular dysplasia or significant stenosis. IMA: Patent without evidence of aneurysm, dissection, vasculitis or significant stenosis. Inflow: Patent without evidence of aneurysm, dissection, vasculitis or significant stenosis. Veins: No obvious venous abnormality within the limitations of this arterial phase study. Review of the MIP images confirms the above findings. NON-VASCULAR Hepatobiliary: No focal liver abnormality is seen. No gallstones, gallbladder wall thickening, or biliary dilatation. Pancreas: Unremarkable. No pancreatic ductal dilatation or surrounding inflammatory changes. Spleen: Normal in size without focal abnormality. Adrenals/Urinary Tract: Unchanged mild thickening of the left adrenal gland. The right adrenal gland is unremarkable. No renal mass, calculi, or hydronephrosis. The bladder is unremarkable. Stomach/Bowel: Stomach is within normal limits. Appendix appears normal. No evidence of bowel wall thickening, distention, or inflammatory changes. Sigmoid colonic diverticulosis. Lymphatic: No enlarged abdominal or pelvic lymph nodes. Reproductive: Prostate is unremarkable. Other: Moderate to large fat containing umbilical hernia, increased in size since 2009. No free fluid or pneumoperitoneum. Musculoskeletal: No acute or significant osseous findings. Review of the MIP images confirms the above findings. IMPRESSION: 1. No evidence of acute aortic syndrome. No acute intrathoracic or intra-abdominal process. 2. Moderate to large fat containing umbilical  hernia, increased in size since 2009. 3. Aortic Atherosclerosis (ICD10-I70.0). Electronically Signed   By: WTitus DubinM.D.   On: 06/29/2021 12:49    Procedures Procedures   Medications Ordered in ED Medications  nitroGLYCERIN (NITROSTAT) SL tablet 0.4 mg (0.4 mg Sublingual Given 06/29/21 1242)  aspirin chewable tablet 324 mg (324 mg Oral Given 06/29/21 1035)  iohexol (OMNIPAQUE) 350 MG/ML injection 100 mL (100 mLs Intravenous Contrast Given 06/29/21 1231)  ketorolac (TORADOL) 15 MG/ML injection 15 mg (15 mg Intravenous Given 06/29/21 1339)  alum & mag hydroxide-simeth (MAALOX/MYLANTA) 200-200-20 MG/5ML suspension 30 mL (30 mLs Oral Given 06/29/21 1340)    And  lidocaine (XYLOCAINE) 2 % viscous mouth solution 15 mL (15 mLs Oral Given 06/29/21 1340)    ED Course  I have reviewed the  triage vital signs and the nursing notes.  Pertinent labs & imaging results that were available during my care of the patient were reviewed by me and considered in my medical decision making (see chart for details).  Clinical Course as of 06/29/21 1511  Fri Jun 29, 2021  1256 Reviewed CTA and no acute abnormalities.  Chronic umbilical hernia noted [DR]  1310 Reported and repeat troponin reviewed [DR]    Clinical Course User Index [DR] Pattricia Boss, MD   MDM Rules/Calculators/A&P HEAR Score: 5                       Medical Decision Making Patient presents today complaining of right lower anterior chest pain radiating from approximately the xiphoid process around to the right flank area.  Pain began at about 130 this morning.  It is moderate around 5 currently.  It is a pressure in nature. Is evaluated here with labs, imaging, EKG,.  Interventions include pain medicine, nitroglycerin.  Amount and/or Complexity of Data Reviewed External Data Reviewed: notes. Labs: ordered. Decision-making details documented in ED Course. Radiology: ordered and independent interpretation performed. Decision-making  details documented in ED Course. ECG/medicine tests: ordered and independent interpretation performed. Decision-making details documented in ED Course.  Risk Risk Details: Differential diagnosis includes MI, acute coronary syndrome, PE, aortic dissection, abdominal aortic aneurysm, other diseases Especially pneumothorax, infiltrate, diseases of the upper abdomen including acute cholecystitis, pancreatitis, gastritis. Given normal CBC, be met, CT that reveals normal gallbladder without gallstones, no evidence of dissection, aneurysm, or PE, I have a low index of suspicion for MI with troponins normal. Patient given nitroglycerin here without any change Toradol and GI cocktail being given now.  Patient improved after GI cocktail. Bili slightly elevated Patient appears stable for discharge. Discussed return precautions and need for follow-up with patient and his wife and they voiced understanding Patient states that his primary care is closed.  We have discussed how to obtain new primary care follow-up I have given him a referral to gastroenterology.  After discussion about his hernia he will be given referral to general surgery although he does not report any acute symptoms with this.   Final Clinical Impression(s) / ED Diagnoses Final diagnoses:  Chest pain, unspecified type  Right upper quadrant abdominal pain  Elevated bilirubin    Rx / DC Orders ED Discharge Orders     None        Pattricia Boss, MD 06/29/21 1511

## 2021-06-29 NOTE — ED Notes (Signed)
Patient transported to CT 

## 2021-06-29 NOTE — ED Triage Notes (Signed)
Pt reports at 1:30am began having pain in right shoulder blade. Pt states pain was grabbing in nature, constant. Pt reports now feels SOB. Pt reports pain radiates to upper abdomen. Pt denies cardiac hx. Pain 4/10.

## 2021-06-29 NOTE — Discharge Instructions (Signed)
Your heart evaluation here in the emergency department appear normal CT of the chest and abdomen also appeared normal You do have a slightly elevated bilirubin at 2.0. Please get your Protonix prescription filled and take this regularly Call for follow-up with gastroenterology Return if you are having worsening symptoms especially worsening pain or new symptoms such as nausea, vomiting, or shortness of breath.

## 2022-04-24 ENCOUNTER — Ambulatory Visit: Payer: Medicare Other | Admitting: Physician Assistant

## 2022-08-15 IMAGING — CT CT ANGIO CHEST-ABD-PELV FOR DISSECTION W/ AND WO/W CM
2 of 7 series · 14 of 46 positions shown, 16 images · non-contrast
Comparison: Chest x-ray from same day. CT abdomen pelvis dated [facility]

CLINICAL DATA: Chest pain.

EXAM:
CT ANGIOGRAPHY CHEST, ABDOMEN AND PELVIS
TECHNIQUE: Non-contrast CT of the chest was initially obtained.

[Series 7: dissection 2mm · axial · 0.87mm/px · z∈[+753,+1337]mm · 11 of 330 slices shown, 13 images]
[im 19/330  soft-tissue]
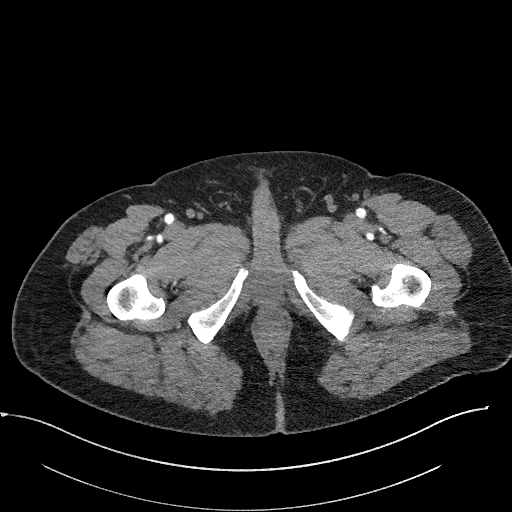
[im 19/330  bone]
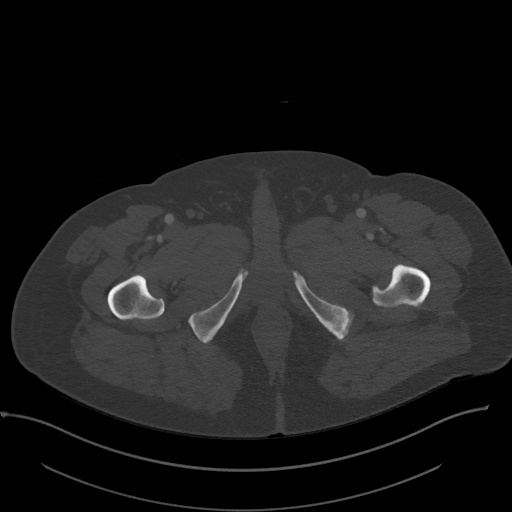
[im 55/330  soft-tissue]
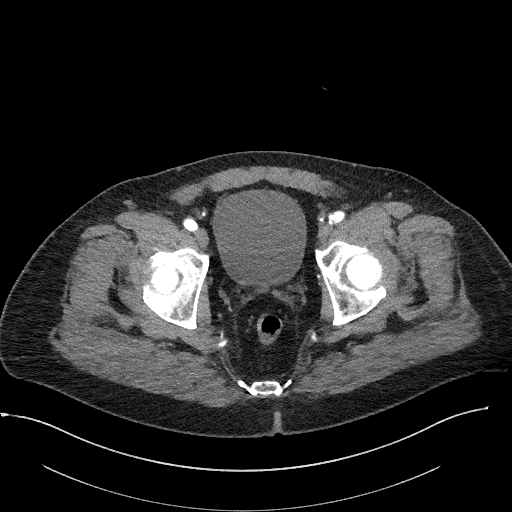
[im 74/330  soft-tissue]
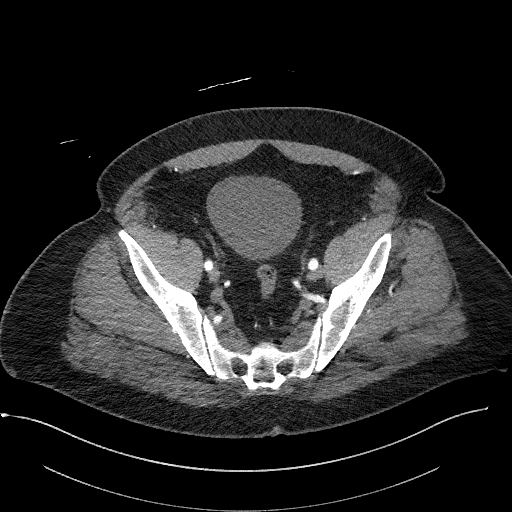
[im 110/330  soft-tissue]
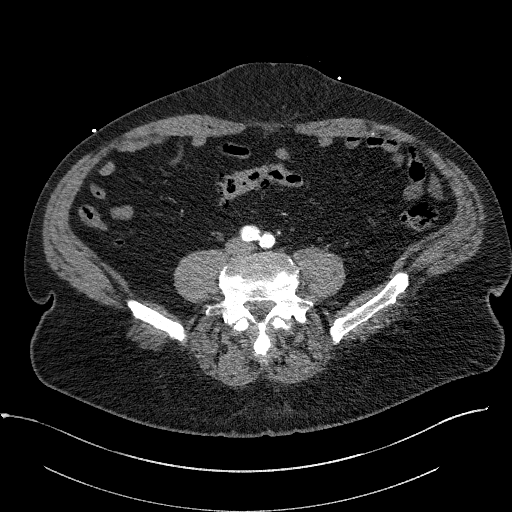
[im 128/330  soft-tissue]
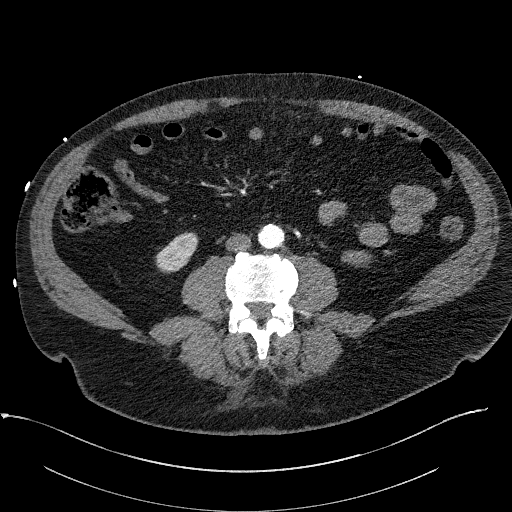
[im 165/330  soft-tissue]
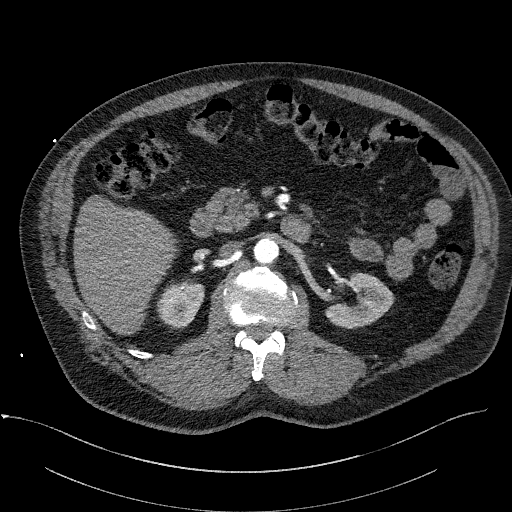
[im 202/330  soft-tissue]
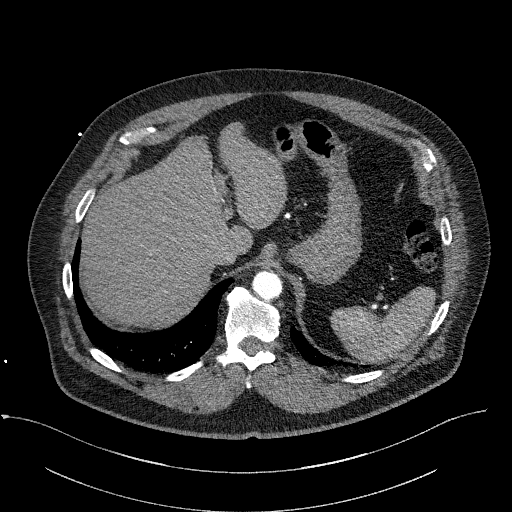
[im 220/330  soft-tissue]
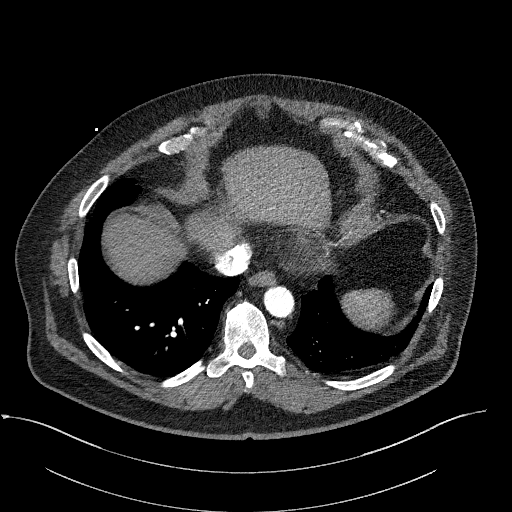
[im 256/330  soft-tissue]
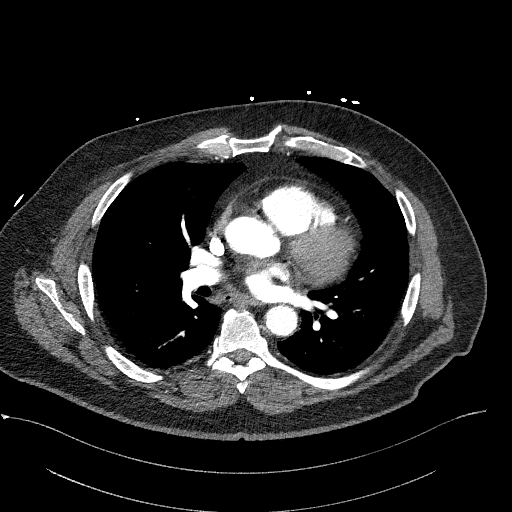
[im 256/330  bone]
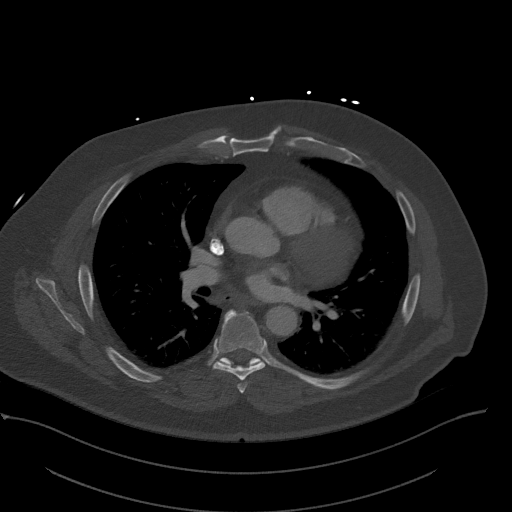
[im 275/330  soft-tissue]
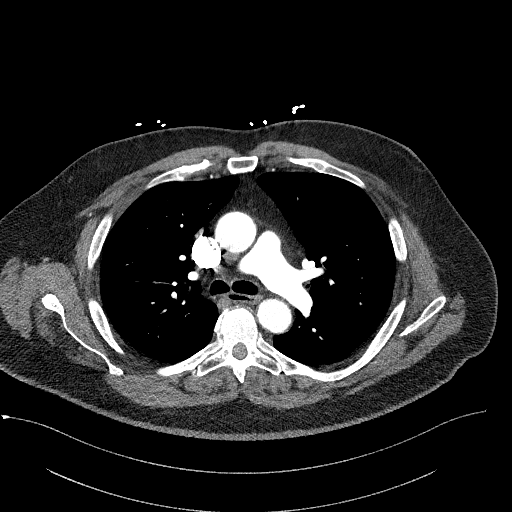
[im 311/330  soft-tissue]
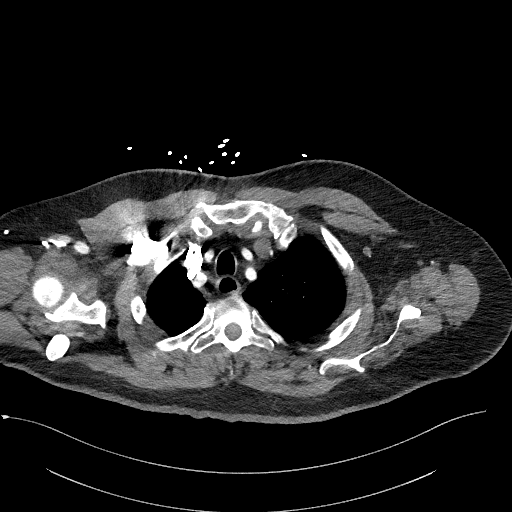

[Series 10: dissection 2mm cor · coronal · 0.94mm/px · 3 of 165 slices shown]
[im 42/165  soft-tissue]
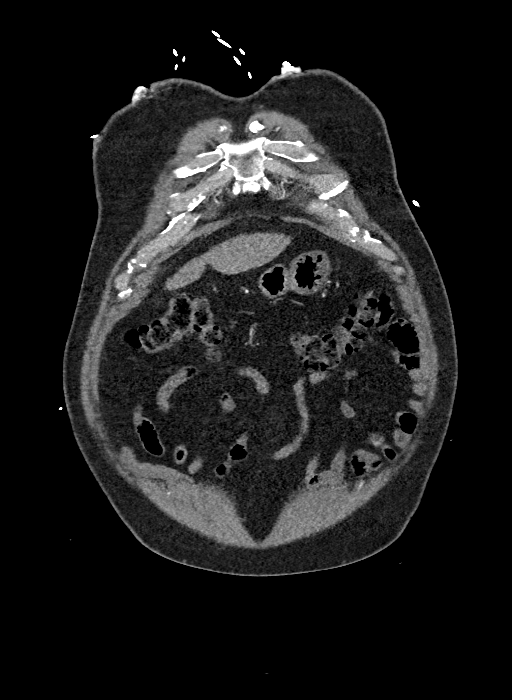
[im 83/165  soft-tissue]
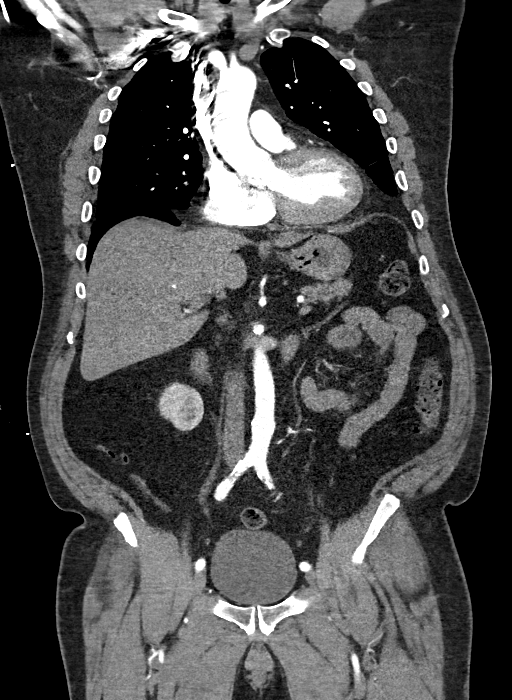
[im 124/165  soft-tissue]
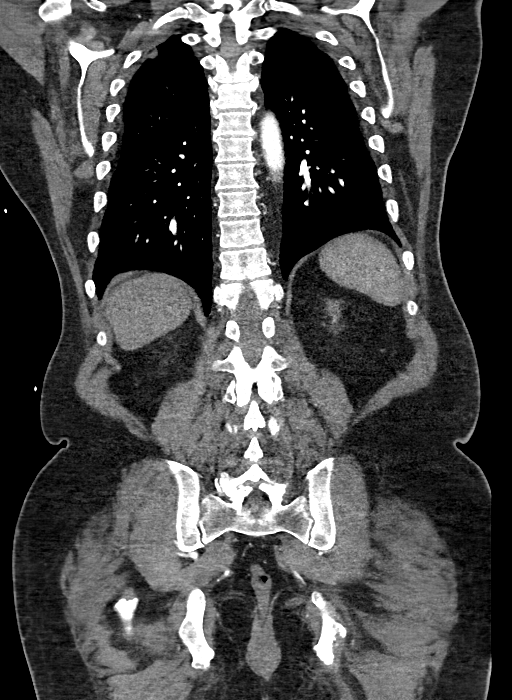

[14 of 46 positions shown; findings below may reference images not displayed]

Multidetector CT imaging through the chest, abdomen and pelvis was
performed using the standard protocol during bolus administration of
intravenous contrast. Multiplanar reconstructed images and MIPs were
obtained and reviewed to evaluate the vascular anatomy.

CONTRAST:  100mL OMNIPAQUE IOHEXOL 350 MG/ML SOLN
FINDINGS: CTA CHEST FINDINGS

Cardiovascular: Preferential opacification of the thoracic aorta. No
evidence of thoracic aortic aneurysm or dissection. Mild aortic arch
and coronary artery atherosclerotic calcification. Normal heart
size. No pericardial effusion. No pulmonary embolism.

Mediastinum/Nodes: No enlarged mediastinal, hilar, or axillary lymph
nodes. 1.0 cm hypodense nodule in the left thyroid lobe. Not
clinically significant; no follow-up imaging recommended. Trachea
and esophagus demonstrate no significant findings.

Lungs/Pleura: No focal consolidation, pleural effusion, or
pneumothorax.

Musculoskeletal: No chest wall abnormality. No acute or significant
osseous findings.

Review of the MIP images confirms the above findings.

CTA ABDOMEN AND PELVIS FINDINGS

VASCULAR

Aorta: Normal caliber aorta without aneurysm, dissection, vasculitis
or significant stenosis. Mild atherosclerotic calcification.

Celiac: Patent without evidence of aneurysm, dissection, vasculitis
or significant stenosis. Accessory left hepatic artery arising from
the left gastric artery.

SMA: Patent without evidence of aneurysm, dissection, vasculitis or
significant stenosis. Replaced right hepatic artery arising from the
SMA.

Renals: Both renal arteries are patent without evidence of aneurysm,
dissection, vasculitis, fibromuscular dysplasia or significant
stenosis.

IMA: Patent without evidence of aneurysm, dissection, vasculitis or
significant stenosis.

Inflow: Patent without evidence of aneurysm, dissection, vasculitis
or significant stenosis.

Veins: No obvious venous abnormality within the limitations of this
arterial phase study.

Review of the MIP images confirms the above findings.

NON-VASCULAR

Hepatobiliary: No focal liver abnormality is seen. No gallstones,
gallbladder wall thickening, or biliary dilatation.

Pancreas: Unremarkable. No pancreatic ductal dilatation or
surrounding inflammatory changes.

Spleen: Normal in size without focal abnormality.

Adrenals/Urinary Tract: Unchanged mild thickening of the left
adrenal gland. The right adrenal gland is unremarkable. No renal
mass, calculi, or hydronephrosis. The bladder is unremarkable.

Stomach/Bowel: Stomach is within normal limits. Appendix appears
normal. No evidence of bowel wall thickening, distention, or
inflammatory changes. Sigmoid colonic diverticulosis.

Lymphatic: No enlarged abdominal or pelvic lymph nodes.

Reproductive: Prostate is unremarkable.

Other: Moderate to large fat containing umbilical hernia, increased
in size since 5448. No free fluid or pneumoperitoneum.

Musculoskeletal: No acute or significant osseous findings.

Review of the MIP images confirms the above findings.
IMPRESSION: 1. No evidence of acute aortic syndrome. No acute intrathoracic or
intra-abdominal process.
2. Moderate to large fat containing umbilical hernia, increased in
size since [DATE]. Aortic Atherosclerosis ([id number]).

## 2023-06-10 DIAGNOSIS — Z08 Encounter for follow-up examination after completed treatment for malignant neoplasm: Secondary | ICD-10-CM | POA: Diagnosis not present

## 2023-06-10 DIAGNOSIS — D1801 Hemangioma of skin and subcutaneous tissue: Secondary | ICD-10-CM | POA: Diagnosis not present

## 2023-06-10 DIAGNOSIS — L821 Other seborrheic keratosis: Secondary | ICD-10-CM | POA: Diagnosis not present

## 2023-06-10 DIAGNOSIS — L814 Other melanin hyperpigmentation: Secondary | ICD-10-CM | POA: Diagnosis not present

## 2023-06-10 DIAGNOSIS — Z85828 Personal history of other malignant neoplasm of skin: Secondary | ICD-10-CM | POA: Diagnosis not present

## 2023-06-10 DIAGNOSIS — L820 Inflamed seborrheic keratosis: Secondary | ICD-10-CM | POA: Diagnosis not present

## 2023-07-24 DIAGNOSIS — H0102B Squamous blepharitis left eye, upper and lower eyelids: Secondary | ICD-10-CM | POA: Diagnosis not present

## 2023-07-24 DIAGNOSIS — H02132 Senile ectropion of right lower eyelid: Secondary | ICD-10-CM | POA: Diagnosis not present

## 2023-07-24 DIAGNOSIS — Z961 Presence of intraocular lens: Secondary | ICD-10-CM | POA: Diagnosis not present

## 2023-07-24 DIAGNOSIS — H02135 Senile ectropion of left lower eyelid: Secondary | ICD-10-CM | POA: Diagnosis not present

## 2023-07-24 DIAGNOSIS — H5789 Other specified disorders of eye and adnexa: Secondary | ICD-10-CM | POA: Diagnosis not present

## 2023-09-04 DIAGNOSIS — H0102B Squamous blepharitis left eye, upper and lower eyelids: Secondary | ICD-10-CM | POA: Diagnosis not present

## 2023-09-04 DIAGNOSIS — H02132 Senile ectropion of right lower eyelid: Secondary | ICD-10-CM | POA: Diagnosis not present

## 2023-09-04 DIAGNOSIS — H02135 Senile ectropion of left lower eyelid: Secondary | ICD-10-CM | POA: Diagnosis not present

## 2023-09-04 DIAGNOSIS — Z961 Presence of intraocular lens: Secondary | ICD-10-CM | POA: Diagnosis not present

## 2023-09-04 DIAGNOSIS — H5789 Other specified disorders of eye and adnexa: Secondary | ICD-10-CM | POA: Diagnosis not present

## 2023-09-18 DIAGNOSIS — Z961 Presence of intraocular lens: Secondary | ICD-10-CM | POA: Diagnosis not present

## 2023-09-18 DIAGNOSIS — H0102B Squamous blepharitis left eye, upper and lower eyelids: Secondary | ICD-10-CM | POA: Diagnosis not present

## 2023-09-18 DIAGNOSIS — H5789 Other specified disorders of eye and adnexa: Secondary | ICD-10-CM | POA: Diagnosis not present

## 2023-09-18 DIAGNOSIS — H02135 Senile ectropion of left lower eyelid: Secondary | ICD-10-CM | POA: Diagnosis not present

## 2023-09-18 DIAGNOSIS — H02132 Senile ectropion of right lower eyelid: Secondary | ICD-10-CM | POA: Diagnosis not present

## 2023-10-06 ENCOUNTER — Ambulatory Visit
Admission: EM | Admit: 2023-10-06 | Discharge: 2023-10-06 | Disposition: A | Discharge status: Home / Self Care | Attending: Family Medicine | Admitting: Family Medicine

## 2023-10-06 DIAGNOSIS — J309 Allergic rhinitis, unspecified: Secondary | ICD-10-CM

## 2023-10-06 DIAGNOSIS — H65191 Other acute nonsuppurative otitis media, right ear: Secondary | ICD-10-CM | POA: Diagnosis not present

## 2023-10-06 DIAGNOSIS — H6991 Unspecified Eustachian tube disorder, right ear: Secondary | ICD-10-CM

## 2023-10-06 MED ORDER — AMOXICILLIN 875 MG PO TABS: 875.0000 mg | ORAL_TABLET | Freq: Two times a day (BID) | ORAL | 0 refills | Status: AC

## 2023-10-06 NOTE — ED Triage Notes (Signed)
 Patient is here with right ear fullness and pain x 1 day. Patient states he has a lot of sinus issue.  Home Intervention: Sudafed and Motrin.

## 2023-10-06 NOTE — ED Provider Notes (Signed)
 [facility]  Note:  This document was prepared using Conservation officer, historic buildings and may include unintentional dictation errors.  MRN: [id number] DOB: [birth year]  Subjective:   Brent Banker Sr. is a 78 y.o. male presenting for 1 day history of moderate persistent worsening right ear fullness, decreased hearing and pain.  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  Has eustachian tube dysfunction.  Uses pseudoephedrine regularly.  No fever, ear drainage, tinnitus, dizziness.  No current facility-administered medications for this encounter.  Current Outpatient Medications:    acetaminophen (TYLENOL) 500 MG tablet, Take 1,000 mg by mouth every 6 (six) hours as needed for mild pain., Disp: , Rfl:    ibuprofen (ADVIL,MOTRIN) 200 MG tablet, Take 200 mg by mouth every 6 (six) hours as needed for mild pain., Disp: , Rfl:    pseudoephedrine (SUDAFED) 120 MG 12 hr tablet, Take 120 mg by mouth daily as needed for congestion., Disp: , Rfl:    fluticasone (FLONASE) 50 MCG/ACT nasal spray, 2 spr each nostril hs (Patient taking differently: 2 sprays daily as needed for allergies.), Disp: 16 g, Rfl: 6   Oxymetazoline HCl (SINEX ULTRA FINE MIST 12-HOUR NA), Place 1 spray into the nose every 12 (twelve) hours as needed (congestion)., Disp: , Rfl:    pantoprazole (PROTONIX) 20 MG tablet, Take 1 tablet (20 mg total) by mouth daily., Disp: 30 tablet, Rfl: 0   Allergies  Allergen Reactions   Codeine Other (See Comments)    Passed out    Past Medical History:  Diagnosis Date   Dizziness      Past Surgical History:  Procedure Laterality Date   ANTERIOR CRUCIATE LIGAMENT REPAIR     FOOT SURGERY     ROTATOR CUFF REPAIR      Family History  Problem Relation Age of Onset   Asthma Mother    Alzheimer's disease Father    Stroke Maternal Grandfather    Diabetes Paternal Grandmother     Social History   Tobacco Use   Smoking status: Former     Types: Cigarettes   Smokeless tobacco: Former  Building services engineer status: Never Used  Substance Use Topics   Alcohol use: Yes    Alcohol/week: 2.0 standard drinks of alcohol    Types: 2 Cans of beer per week    Comment: daily   Drug use: No    ROS   Objective:   Vitals: BP (!) 147/90 (BP Location: Left Arm)   Pulse 77   Temp 98.5 F (36.9 C) (Oral)   Resp 18   SpO2 96%   Physical Exam Constitutional:      General: He is not in acute distress.    Appearance: Normal appearance. He is well-developed and normal weight. He is not ill-appearing, toxic-appearing or diaphoretic.  HENT:     Head: Normocephalic and atraumatic.     Right Ear: Ear canal and external ear normal. No drainage. There is no impacted cerumen. Tympanic membrane is injected, erythematous and bulging.     Left Ear: Tympanic membrane, ear canal and external ear normal. No drainage. There is no impacted cerumen. Tympanic membrane is not injected, erythematous or bulging.     Nose: Nose normal.     Mouth/Throat:     Pharynx: Oropharynx is clear.  Eyes:     General: No scleral icterus.       Right eye: No discharge.  Left eye: No discharge.     Extraocular Movements: Extraocular movements intact.  Cardiovascular:     Rate and Rhythm: Normal rate.  Pulmonary:     Effort: Pulmonary effort is normal.  Musculoskeletal:     Cervical back: Normal range of motion.  Neurological:     Mental Status: He is alert and oriented to person, place, and time.  Psychiatric:        Mood and Affect: Mood normal.        Behavior: Behavior normal.        Thought Content: Thought content normal.        Judgment: Judgment normal.     Assessment and Plan :   PDMP not reviewed this encounter.  1. Other non-recurrent acute nonsuppurative otitis media of right ear   2. Allergic rhinitis, unspecified seasonality, unspecified trigger   3. Eustachian tube dysfunction, right    Start amoxicillin to cover for otitis  media. Use supportive care otherwise. Counseled patient on potential for adverse effects with medications prescribed/recommended today, ER and return-to-clinic precautions discussed, patient verbalized understanding.     Wallis Bamberg, [place] 10/06/23 [phone]

## 2023-10-20 ENCOUNTER — Ambulatory Visit
Admission: EM | Admit: 2023-10-20 | Discharge: 2023-10-20 | Disposition: A | Discharge status: Home / Self Care | Attending: Family Medicine | Admitting: Family Medicine

## 2023-10-20 DIAGNOSIS — H6991 Unspecified Eustachian tube disorder, right ear: Secondary | ICD-10-CM

## 2023-10-20 MED ORDER — PREDNISONE 20 MG PO TABS
ORAL_TABLET | ORAL | 0 refills | Status: AC
Start: 2023-10-20 — End: ?

## 2023-10-20 NOTE — ED Triage Notes (Signed)
 Patient states some pressure in his right ear. Patient states he finish all his medication that was prescribed last week.

## 2023-10-20 NOTE — ED Provider Notes (Signed)
 [facility]  Note:  This document was prepared using Conservation officer, historic buildings and may include unintentional dictation errors.  MRN: [id number] DOB: Apr 02, 1946  Subjective:   Brent Kirk Sr. is a 78 y.o. male presenting for recheck on persistent right ear fullness, decreased hearing and intermittent pain and popping.  Patient was recently seen and treated for otitis media of the right ear.  He just completed a course of antibiotics at the end of last week.  Continues to take Zyrtec.  No fever, ear drainage, tinnitus.  He does have intermittent sinus congestion still.  No current facility-administered medications for this encounter.  Current Outpatient Medications:    acetaminophen (TYLENOL) 500 MG tablet, Take 1,000 mg by mouth every 6 (six) hours as needed for mild pain., Disp: , Rfl:    amoxicillin  (AMOXIL ) 875 MG tablet, Take 1 tablet (875 mg total) by mouth 2 (two) times daily., Disp: 20 tablet, Rfl: 0   fluticasone  (FLONASE ) 50 MCG/ACT nasal spray, 2 spr each nostril hs (Patient taking differently: 2 sprays daily as needed for allergies.), Disp: 16 g, Rfl: 6   ibuprofen (ADVIL,MOTRIN) 200 MG tablet, Take 200 mg by mouth every 6 (six) hours as needed for mild pain., Disp: , Rfl:    Oxymetazoline HCl (SINEX ULTRA FINE MIST 12-HOUR NA), Place 1 spray into the nose every 12 (twelve) hours as needed (congestion)., Disp: , Rfl:    pantoprazole  (PROTONIX ) 20 MG tablet, Take 1 tablet (20 mg total) by mouth daily., Disp: 30 tablet, Rfl: 0   pseudoephedrine  (SUDAFED) 120 MG 12 hr tablet, Take 120 mg by mouth daily as needed for congestion., Disp: , Rfl:    Allergies  Allergen Reactions   Codeine Other (See Comments)    Passed out    Past Medical History:  Diagnosis Date   Dizziness      Past Surgical History:  Procedure Laterality Date   ANTERIOR CRUCIATE LIGAMENT REPAIR     FOOT SURGERY     ROTATOR CUFF REPAIR      Family History  Problem  Relation Age of Onset   Asthma Mother    Alzheimer's disease Father    Stroke Maternal Grandfather    Diabetes Paternal Grandmother     Social History   Tobacco Use   Smoking status: Former    Types: Cigarettes   Smokeless tobacco: Former  Building services engineer status: Never Used  Substance Use Topics   Alcohol use: Yes    Alcohol/week: 2.0 standard drinks of alcohol    Types: 2 Cans of beer per week    Comment: daily   Drug use: No    ROS   Objective:   Vitals: BP (!) 173/85 (BP Location: Left Arm)   Pulse 63   Temp 98.6 F (37 C) (Oral)   Resp 16   SpO2 95%   Physical Exam Constitutional:      General: He is not in acute distress.    Appearance: Normal appearance. He is normal weight. He is not ill-appearing, toxic-appearing or diaphoretic.  HENT:     Head: Normocephalic and atraumatic.     Right Ear: Ear canal and external ear normal. No drainage, swelling or tenderness. No middle ear effusion. There is no impacted cerumen. Tympanic membrane is not erythematous or bulging.     Left Ear: Tympanic membrane, ear canal and external ear normal. No drainage, swelling or tenderness.  No middle ear effusion. There is no impacted  cerumen. Tympanic membrane is not erythematous or bulging.     Ears:     Comments: Slight middle ear effusion of the right ear.    Nose: Nose normal. No congestion or rhinorrhea.     Mouth/Throat:     Mouth: Mucous membranes are moist.     Pharynx: No oropharyngeal exudate or posterior oropharyngeal erythema.  Eyes:     General: No scleral icterus.       Right eye: No discharge.        Left eye: No discharge.     Extraocular Movements: Extraocular movements intact.     Conjunctiva/sclera: Conjunctivae normal.  Cardiovascular:     Rate and Rhythm: Normal rate.  Pulmonary:     Effort: Pulmonary effort is normal.  Musculoskeletal:     Cervical back: Normal range of motion and neck supple. No rigidity. No muscular tenderness.  Neurological:      General: No focal deficit present.     Mental Status: He is alert and oriented to person, place, and time.  Psychiatric:        Mood and Affect: Mood normal.        Behavior: Behavior normal.    Assessment and Plan :   PDMP not reviewed this encounter.  1. Eustachian tube disorder, right    Recommended oral prednisone  course for eustachian tube dysfunction of the right ear.  Maintain Zyrtec.  Follow-up with ENT if symptoms persist.  Counseled patient on potential for adverse effects with medications prescribed/recommended today, ER and return-to-clinic precautions discussed, patient verbalized understanding.    Adolph Hoop, PA-C 10/20/23 1500

## 2023-10-27 DIAGNOSIS — H02135 Senile ectropion of left lower eyelid: Secondary | ICD-10-CM | POA: Diagnosis not present

## 2023-10-27 DIAGNOSIS — H5789 Other specified disorders of eye and adnexa: Secondary | ICD-10-CM | POA: Diagnosis not present

## 2023-10-27 DIAGNOSIS — H0102B Squamous blepharitis left eye, upper and lower eyelids: Secondary | ICD-10-CM | POA: Diagnosis not present

## 2023-10-27 DIAGNOSIS — H02132 Senile ectropion of right lower eyelid: Secondary | ICD-10-CM | POA: Diagnosis not present

## 2023-10-27 DIAGNOSIS — Z961 Presence of intraocular lens: Secondary | ICD-10-CM | POA: Diagnosis not present

## 2023-12-02 DIAGNOSIS — M5416 Radiculopathy, lumbar region: Secondary | ICD-10-CM | POA: Diagnosis not present

## 2023-12-03 DIAGNOSIS — H6122 Impacted cerumen, left ear: Secondary | ICD-10-CM | POA: Diagnosis not present

## 2023-12-03 DIAGNOSIS — J301 Allergic rhinitis due to pollen: Secondary | ICD-10-CM | POA: Diagnosis not present

## 2023-12-03 DIAGNOSIS — H903 Sensorineural hearing loss, bilateral: Secondary | ICD-10-CM | POA: Diagnosis not present

## 2023-12-04 DIAGNOSIS — M5416 Radiculopathy, lumbar region: Secondary | ICD-10-CM | POA: Diagnosis not present

## 2023-12-10 DIAGNOSIS — L814 Other melanin hyperpigmentation: Secondary | ICD-10-CM | POA: Diagnosis not present

## 2023-12-10 DIAGNOSIS — L2989 Other pruritus: Secondary | ICD-10-CM | POA: Diagnosis not present

## 2023-12-10 DIAGNOSIS — Z789 Other specified health status: Secondary | ICD-10-CM | POA: Diagnosis not present

## 2023-12-10 DIAGNOSIS — D225 Melanocytic nevi of trunk: Secondary | ICD-10-CM | POA: Diagnosis not present

## 2023-12-10 DIAGNOSIS — L821 Other seborrheic keratosis: Secondary | ICD-10-CM | POA: Diagnosis not present

## 2023-12-10 DIAGNOSIS — L538 Other specified erythematous conditions: Secondary | ICD-10-CM | POA: Diagnosis not present

## 2023-12-10 DIAGNOSIS — L820 Inflamed seborrheic keratosis: Secondary | ICD-10-CM | POA: Diagnosis not present

## 2023-12-12 DIAGNOSIS — K429 Umbilical hernia without obstruction or gangrene: Secondary | ICD-10-CM | POA: Diagnosis not present

## 2023-12-12 DIAGNOSIS — I10 Essential (primary) hypertension: Secondary | ICD-10-CM | POA: Diagnosis not present

## 2023-12-12 DIAGNOSIS — M2140 Flat foot [pes planus] (acquired), unspecified foot: Secondary | ICD-10-CM | POA: Diagnosis not present
